# Patient Record
Sex: Male | Born: 1970 | Race: Black or African American | Hispanic: No | Smoking: Former smoker
Health system: Southern US, Community
[De-identification: ages and names within clinical notes are randomized; demographics above are authoritative.]

## PROBLEM LIST (undated history)

## (undated) DIAGNOSIS — F329 Major depressive disorder, single episode, unspecified: Secondary | ICD-10-CM

## (undated) DIAGNOSIS — F32A Depression, unspecified: Secondary | ICD-10-CM

## (undated) DIAGNOSIS — F4310 Post-traumatic stress disorder, unspecified: Secondary | ICD-10-CM

## (undated) DIAGNOSIS — K611 Rectal abscess: Secondary | ICD-10-CM

## (undated) HISTORY — DX: Rectal abscess: K61.1

## (undated) HISTORY — DX: Depression, unspecified: F32.A

## (undated) HISTORY — DX: Major depressive disorder, single episode, unspecified: F32.9

---
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
## 2005-11-30 HISTORY — PX: FOREARM SURGERY: SHX651

## 2006-06-18 ENCOUNTER — Emergency Department: Payer: Self-pay | Admitting: Emergency Medicine

## 2006-08-28 ENCOUNTER — Emergency Department: Payer: Self-pay | Admitting: Emergency Medicine

## 2007-01-28 ENCOUNTER — Emergency Department: Payer: Self-pay | Admitting: Internal Medicine

## 2007-06-16 ENCOUNTER — Emergency Department: Payer: Self-pay | Admitting: Emergency Medicine

## 2009-02-14 ENCOUNTER — Ambulatory Visit: Payer: Self-pay | Admitting: Family Medicine

## 2011-05-08 ENCOUNTER — Emergency Department: Payer: Self-pay | Admitting: Emergency Medicine

## 2012-01-03 IMAGING — CT CT ABD-PELV W/O CM
1 of 2 series · 15 of 32 positions shown, 19 images · non-contrast
Comparison: none

REASON FOR EXAM: (1) RLQ and R flank pain; (2) R flank and RLQ pain,
Stone protocol
COMMENTS:   May transport without cardiac monitor

PROCEDURE:     CT  - CT ABDOMEN AND PELVIS W[DATE]  [DATE]
RESULT:     Comparison: None
TECHNIQUE: Multiple axial images from the lung bases to the symphysis pubis
were obtained without oral and without intravenous contrast.

[Series 2: 3mm soft tissue · axial · 0.70mm/px · z∈[-1575,-1170]mm · 15 of 149 slices shown, 19 images]
[im 7/149  soft-tissue]
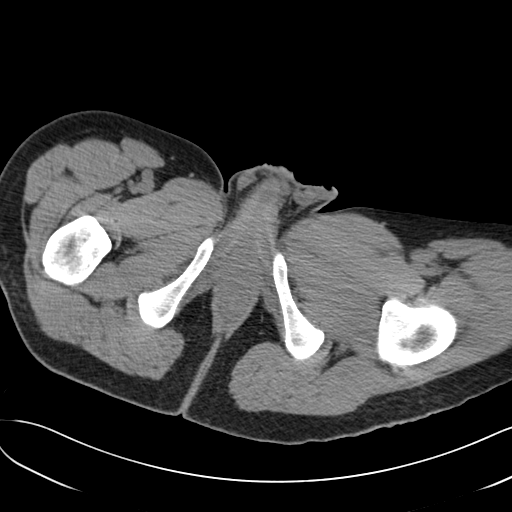
[im 7/149  bone]
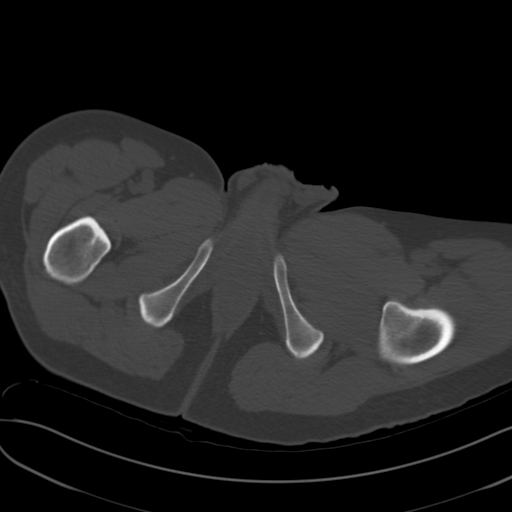
[im 19/149  soft-tissue]
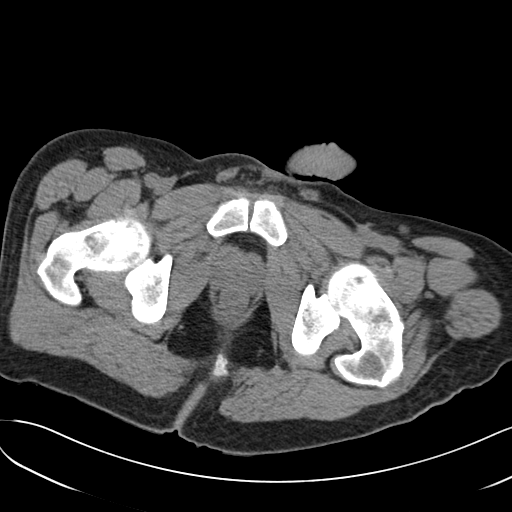
[im 31/149  soft-tissue]
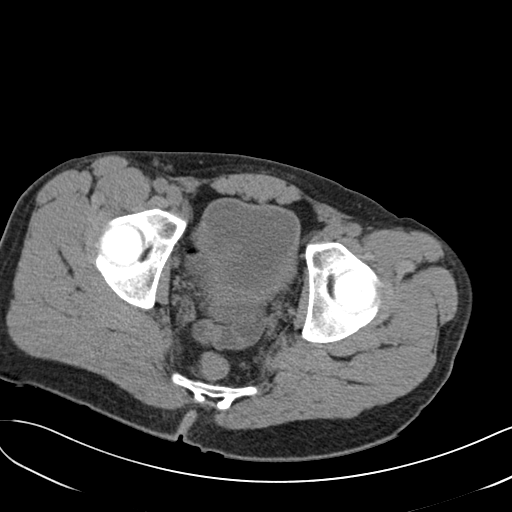
[im 44/149  soft-tissue]
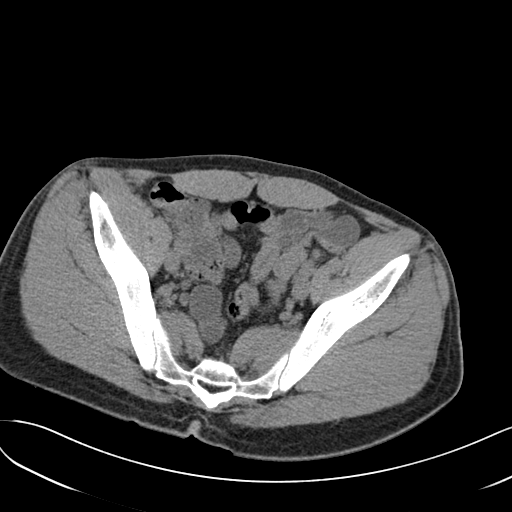
[im 50/149  soft-tissue]
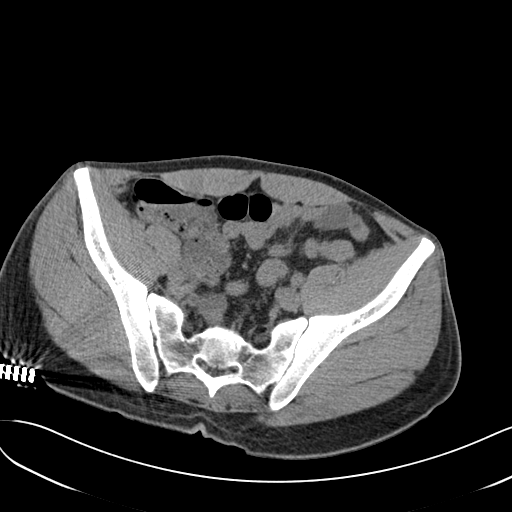
[im 62/149  soft-tissue]
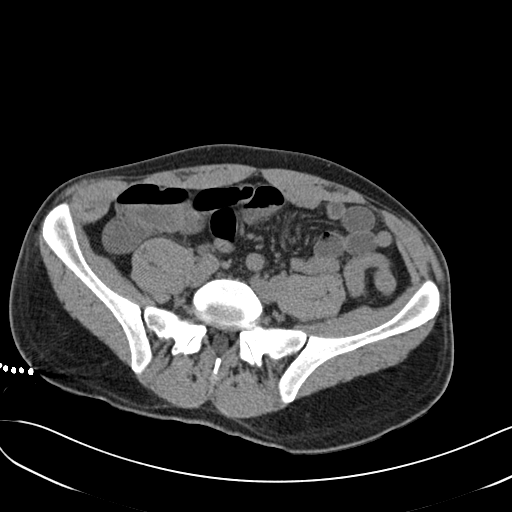
[im 75/149  soft-tissue]
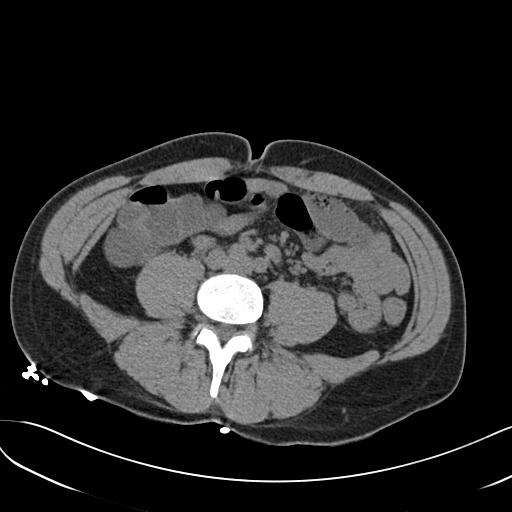
[im 87/149  soft-tissue]
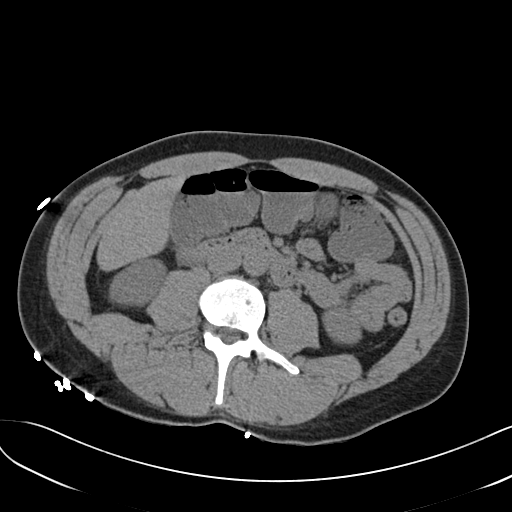
[im 99/149  soft-tissue]
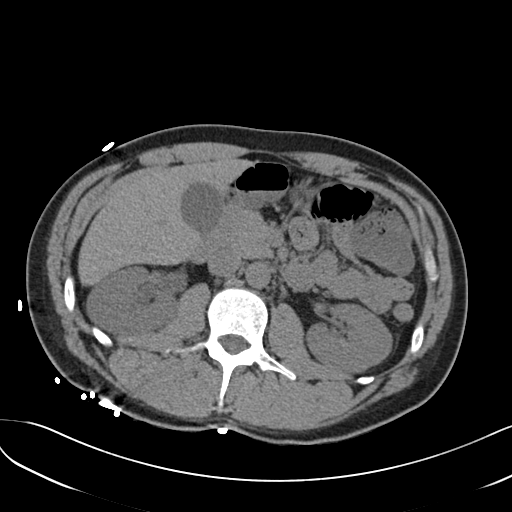
[im 99/149  bone]
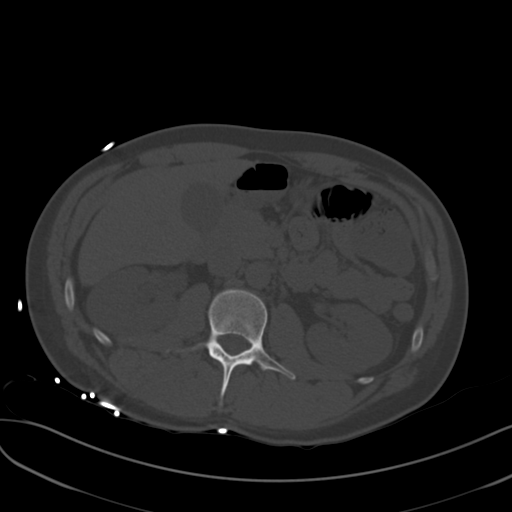
[im 105/149  soft-tissue]
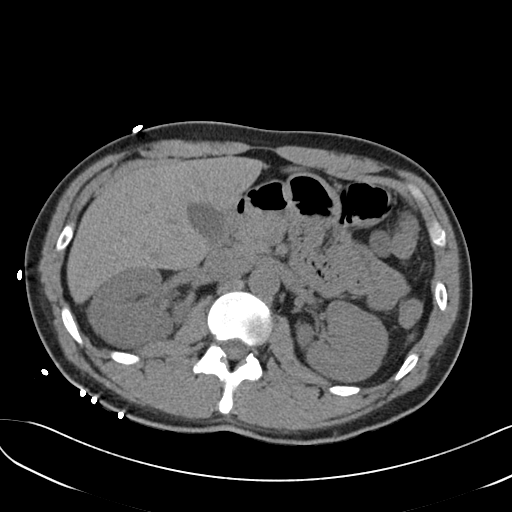
[im 118/149  soft-tissue]
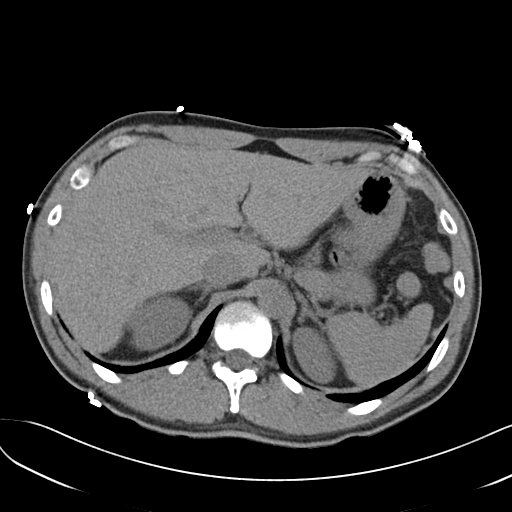
[im 124/149  lung]
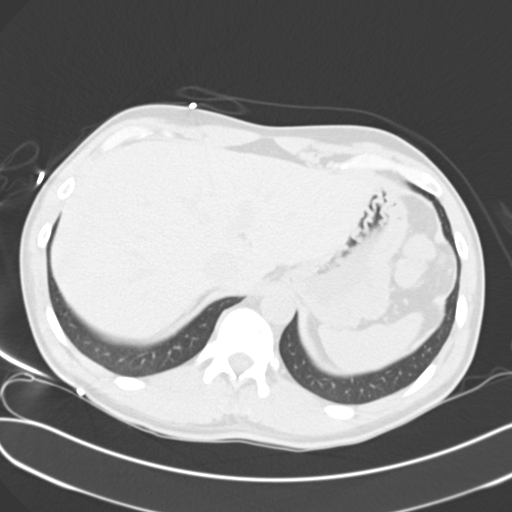
[im 130/149  soft-tissue]
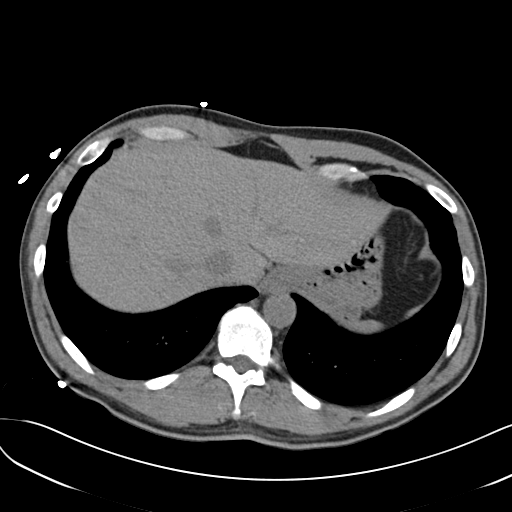
[im 130/149  lung]
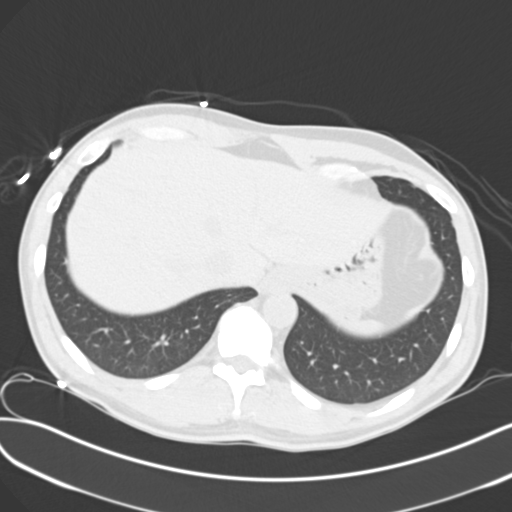
[im 136/149  lung]
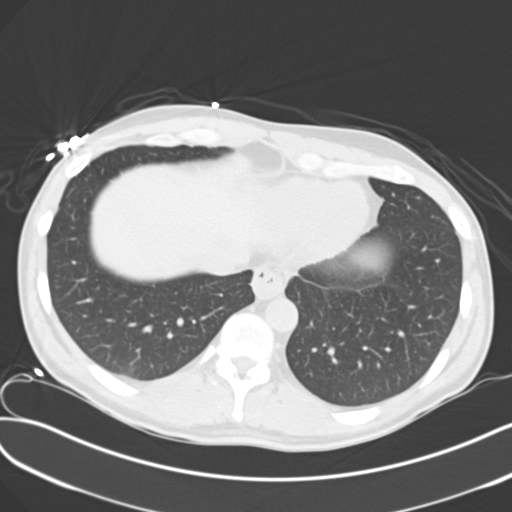
[im 142/149  soft-tissue]
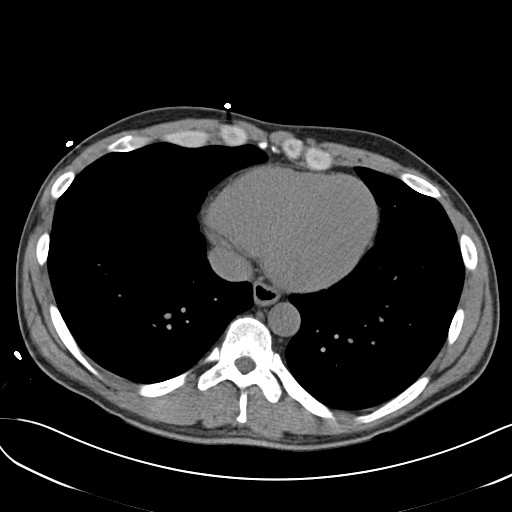
[im 142/149  lung]
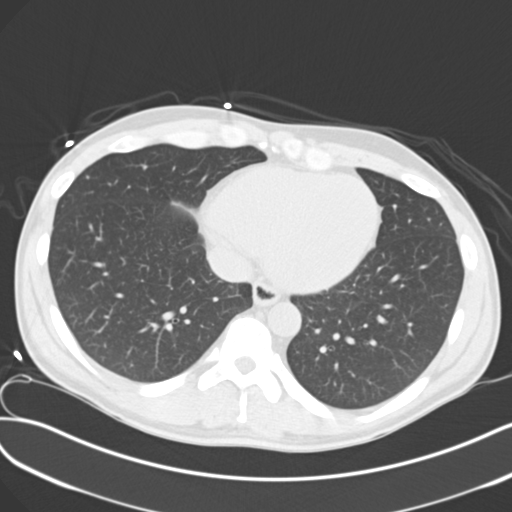

[15 of 32 positions shown; findings below may reference images not displayed]

FINDINGS: Lack of intravenous contrast limits evaluation of the solid abdominal
organs.  Grossly, the liver, gallbladder, spleen, adrenals, and pancreas are
unremarkable. There is mild dilatation of the right ureter and right renal
collecting system. A 4 mm calculus is demonstrated just proximal to the
right ureterovesical junction.

The small and large bowel are normal in caliber. The appendix is normal.

No aggressive lytic or sclerotic osseous lesions identified.
IMPRESSION: 4 mm calculus just proximal to the right ureterovesical junction, causing
mild proximal obstruction.

## 2013-08-18 DIAGNOSIS — F329 Major depressive disorder, single episode, unspecified: Secondary | ICD-10-CM | POA: Insufficient documentation

## 2014-04-10 DIAGNOSIS — R109 Unspecified abdominal pain: Secondary | ICD-10-CM | POA: Diagnosis not present

## 2014-04-10 DIAGNOSIS — R51 Headache: Secondary | ICD-10-CM | POA: Diagnosis not present

## 2014-04-10 DIAGNOSIS — R221 Localized swelling, mass and lump, neck: Secondary | ICD-10-CM | POA: Diagnosis not present

## 2014-04-10 DIAGNOSIS — R220 Localized swelling, mass and lump, head: Secondary | ICD-10-CM | POA: Diagnosis not present

## 2014-04-10 DIAGNOSIS — R599 Enlarged lymph nodes, unspecified: Secondary | ICD-10-CM | POA: Diagnosis not present

## 2014-04-10 DIAGNOSIS — R5383 Other fatigue: Secondary | ICD-10-CM | POA: Diagnosis not present

## 2014-04-10 DIAGNOSIS — T148 Other injury of unspecified body region: Secondary | ICD-10-CM | POA: Diagnosis not present

## 2014-04-10 DIAGNOSIS — R5381 Other malaise: Secondary | ICD-10-CM | POA: Diagnosis not present

## 2014-04-10 DIAGNOSIS — F17200 Nicotine dependence, unspecified, uncomplicated: Secondary | ICD-10-CM | POA: Diagnosis not present

## 2014-04-10 DIAGNOSIS — M542 Cervicalgia: Secondary | ICD-10-CM | POA: Diagnosis not present

## 2016-04-13 DIAGNOSIS — F250 Schizoaffective disorder, bipolar type: Secondary | ICD-10-CM | POA: Diagnosis not present

## 2016-04-15 ENCOUNTER — Encounter: Payer: Self-pay | Admitting: *Deleted

## 2016-04-15 ENCOUNTER — Ambulatory Visit
Admission: EM | Admit: 2016-04-15 | Discharge: 2016-04-15 | Disposition: A | Discharge status: Home / Self Care | Payer: Medicare Other | Attending: Family Medicine | Admitting: Family Medicine

## 2016-04-15 DIAGNOSIS — L0291 Cutaneous abscess, unspecified: Secondary | ICD-10-CM

## 2016-04-15 HISTORY — DX: Post-traumatic stress disorder, unspecified: F43.10

## 2016-04-15 MED ORDER — SULFAMETHOXAZOLE-TRIMETHOPRIM 800-160 MG PO TABS: 1.0000 | ORAL_TABLET | Freq: Two times a day (BID) | ORAL | Status: AC

## 2016-04-15 NOTE — ED Notes (Signed)
Abcess on right buttock.

## 2016-04-15 NOTE — ED Provider Notes (Addendum)
CSN: [id number]     Arrival date & time 04/15/16  1240 History   First MD Initiated Contact with Patient 04/15/16 1313     Chief Complaint  Patient presents with  . Recurrent Skin Infections   (Consider location/radiation/quality/duration/timing/severity/associated sxs/prior Treatment) HPI: Patient presents today with symptoms of erythema and swelling of right buttock area. Patient has had no discharge of the area. Patient's symptoms started 2-3 days ago. Patient started taking some old amoxicillin that he had. He denies any fever or chills. He has had a previous abscess drained in this area a few months ago.   Past Medical History  Diagnosis Date  . PTSD (post-traumatic stress disorder)    History reviewed. No pertinent past surgical history. History reviewed. No pertinent family history. Social History  Substance Use Topics  . Smoking status: Current Every Day Smoker -- 1.00 packs/day    Types: Cigarettes  . Smokeless tobacco: None  . Alcohol Use: No    Review of Systems: Negative except mentioned above.   Allergies  Lithium  Home Medications   Prior to Admission medications   Medication Sig Start Date End Date Taking? Authorizing Provider  amphetamine-dextroamphetamine (ADDERALL) 20 MG tablet Take 20 mg by mouth daily.   Yes Historical Provider, MD  diazepam (VALIUM) 10 MG tablet Take 10 mg by mouth every 6 (six) hours as needed for anxiety.   Yes Historical Provider, MD  risperiDONE (RISPERDAL) 0.5 MG tablet Take 0.5 mg by mouth at bedtime.   Yes Historical Provider, MD  venlafaxine (EFFEXOR) 100 MG tablet Take 300 mg by mouth 2 (two) times daily.   Yes Historical Provider, MD   Meds Ordered and Administered this Visit  Medications - No data to display  BP 139/89 mmHg  Pulse 90  Temp(Src) 98.8 F (37.1 C) (Oral)  Resp 16  Ht 5\' 8"  (1.727 m)  Wt 160 lb (72.576 kg)  BMI 24.33 kg/m2  SpO2 98% No data found.   Physical Exam    GENERAL: NAD SKIN: 1 in.- 2 in.  area of  mild erythema and tenderness of right buttock near the cleft which is extending to the rectal area, no abscess identified at the rectum now, small area of induration proximal to the rectal area, no fluctuance appreciated, no discharge of the area    ED Course  Procedures (including critical care time)  Labs Review Labs Reviewed - No data to display  Imaging Review No results found.    MDM   1. Abscess   I've recommended that the patient use warm compresses on the area, will start Bactrim DS, has appointment with general surgery in 2 days. If symptoms do worsen before that time I have asked the patient go to the ER immediately. Patient addresses understanding of plan.    Jolene ProvostKirtida Pranish Akhavan, MD 04/15/16 1334  Jolene ProvostKirtida Maysel Mccolm, MD 04/15/16 1341

## 2016-04-15 NOTE — Discharge Instructions (Signed)
Follow-up with surgeon as instructed. If symptoms do worsen I have recommended that the patient go to the ER.

## 2016-04-17 ENCOUNTER — Other Ambulatory Visit: Payer: Self-pay

## 2016-04-17 ENCOUNTER — Ambulatory Visit (INDEPENDENT_AMBULATORY_CARE_PROVIDER_SITE_OTHER): Payer: Medicare Other | Admitting: Surgery

## 2016-04-17 ENCOUNTER — Encounter: Payer: Self-pay | Admitting: Surgery

## 2016-04-17 VITALS — BP 138/91 | HR 111 | Temp 98.3°F | Ht 68.0 in | Wt 163.8 lb

## 2016-04-17 DIAGNOSIS — K611 Rectal abscess: Secondary | ICD-10-CM | POA: Diagnosis not present

## 2016-04-17 MED ORDER — OXYCODONE-ACETAMINOPHEN 5-325 MG PO TABS: 1.0000 | ORAL_TABLET | ORAL | Status: DC | PRN

## 2016-04-17 NOTE — Progress Notes (Signed)
Patient ID: Ralph Mcgrath, male   DOB: Aug 23, 1971, 45 y.o.   MRN: [id number]  History of Present Illness Ralph Mcgrath is a 45 y.o. male in consultation for severe anorectal pain for the last week or so. He reports that over the last 3 days the pain has been severe sharp and located in the right side of his buttocks. Pain is worsening when he sits down. No fevers no chills He was recently seen by the urgent care and placed on Bactrim. He has had recurrent and perirectal abscesses for last couple of years and he has had an I&D and in the past in [place]. And he does have a history of chronic pain, PTSD and bipolar disorder.  Past Medical History Past Medical History  Diagnosis Date  . PTSD (post-traumatic stress disorder)   . Peri-rectal abscess     Right  . Depression      Past Surgical History  Procedure Laterality Date  . Wisdom tooth extraction  1990  . Forearm surgery Right 2007  . Orbital fracture surgery Left 1992    Allergies  Allergen Reactions  . Lithium Other (See Comments)    Hallucinations    Current Outpatient Prescriptions  Medication Sig Dispense Refill  . amphetamine-dextroamphetamine (ADDERALL) 20 MG tablet Take 20 mg by mouth daily.    . diazepam (VALIUM) 10 MG tablet Take 10 mg by mouth every 6 (six) hours as needed for anxiety.    . risperiDONE (RISPERDAL) 0.5 MG tablet Take 0.5 mg by mouth at bedtime.    . sulfamethoxazole-trimethoprim (BACTRIM DS,SEPTRA DS) 800-160 MG tablet Take 1 tablet by mouth 2 (two) times daily. 20 tablet 0  . venlafaxine XR (EFFEXOR-XR) 150 MG 24 hr capsule Take 1 capsule by mouth 1 day or 1 dose.     No current facility-administered medications for this visit.    Family History Family History  Problem Relation Age of Onset  . Depression Mother   . Cancer Mother     Breast and Uterine  . Hyperthyroidism Father   . Bipolar disorder Father   . Heart disease Father      Social History Social History  Substance Use Topics   . Smoking status: Current Every Day Smoker -- 1.00 packs/day    Types: Cigarettes  . Smokeless tobacco: Never Used  . Alcohol Use: No     ROS 10 pt ROS is otherwise negative   Physical Exam Blood pressure 138/91, pulse 111, temperature 98.3 F (36.8 C), temperature source Oral, height  (1.727 m), weight 74.299 kg (163 lb 12.8 oz).  CONSTITUTIONAL: NAD EARS, NOSE, MOUTH AND THROAT: . Oral mucosa is pink and moist. Hearing is intact to voice.  NECK: Trachea is midline, and there is no jugular venous distension. Thyroid is without palpable abnormalities. LYMPH NODES:  Lymph nodes in the neck are not enlarged. RESPIRATORY:  Lungs are clear, and breath sounds are equal bilaterally. Normal respiratory effort without pathologic use of accessory muscles. CARDIOVASCULAR: Heart is regular without murmurs, gallops, or rubs. GI: The abdomen is  soft, nontender, and nondistended. There were no palpable masses. There was no hepatosplenomegaly. . Rectal: medium size perirectal abscess on the right side, tender and fluctuant MUSCULOSKELETAL:  Normal muscle strength and tone in all four extremities.    SKIN: Skin turgor is normal. There are no pathologic skin lesions.  NEUROLOGIC:  Motor and sensation is grossly normal.  Cranial nerves are grossly intact. PSYCH:  Alert and oriented to person,  place and time. Affect is normal.  Data Reviewed  I have personally reviewed the patient's imaging and medical records.    Assessment/Plan Right perirectal abscess. The patient in detail about the disease process and the need for I&D. And I do think that we can potentially do it under local anesthetic here in the office. Also all for him to do it in the OR tomorrow by one of my partners. He wishes to try to do it here in the office. Discussed with the patient in detail about the procedure, risks, benefits and possible complications including but not limited to: Bleeding, infection, recurrence, prolonged  pain. He understands and wishes to proceed. Consent obtained  Procedure note: Procedures: I/D perirectal abscess  EBL minimal  Findings: abscess  Complications none  He was prepped and draped in the usual sterile fashion and placed in lateral decubitus position. Lidocaine 1% with epinephrine was injected and using a 11 blade knife we drained the abscesses. Purulence was drained, cavity was irrigated. An 1/4-inch packing was placed. No complications  Ralph Pagan, MD FACS  Ralph Mcgrath F Ralph Mcgrath 04/17/2016, 9:32 AM

## 2016-04-17 NOTE — Patient Instructions (Signed)
Please pack this area once daily with packing provided and dress area with dry guaze. If bandage becomes soiled, you will need to replace at that time as well. If you run out of guaze, you may pick this up at any drug store.  Continue taking your bactrim as originally scheduled.  Use the pain medication given today to help relieve severe pain. You may use Ibuprofen, up to 800mg  three times daily for mild-moderate pain.  Please follow-up in office as scheduled below.

## 2016-04-20 ENCOUNTER — Other Ambulatory Visit: Payer: Self-pay | Admitting: Surgery

## 2016-04-20 NOTE — Telephone Encounter (Signed)
Patient was in the office on 5/19 to see Dr Everlene FarrierPabon and had procedure for perirectal abscess. His wife is calling because he is in a lot of pain and would like a refill on his medication.

## 2016-04-20 NOTE — Telephone Encounter (Signed)
I spoke with patient and discussed  the amount of pain medicine and let him know we are unable to refill at this time.  His appointment was moved from Friday to Wednesday of this week. Denies fever, chills. He verbalized understanding and was satisfied that his appointment was moved up for him to be evaluated sooner.

## 2016-04-24 ENCOUNTER — Encounter: Payer: Self-pay | Admitting: Surgery

## 2016-04-24 ENCOUNTER — Ambulatory Visit (INDEPENDENT_AMBULATORY_CARE_PROVIDER_SITE_OTHER): Payer: Medicare Other | Admitting: Surgery

## 2016-04-24 VITALS — BP 134/92 | HR 89 | Temp 98.2°F | Ht 68.0 in | Wt 163.2 lb

## 2016-04-24 DIAGNOSIS — K611 Rectal abscess: Secondary | ICD-10-CM | POA: Insufficient documentation

## 2016-04-24 MED ORDER — OXYCODONE-ACETAMINOPHEN 5-325 MG PO TABS: 1.0000 | ORAL_TABLET | ORAL | Status: DC | PRN

## 2016-04-24 NOTE — Patient Instructions (Signed)
Please call our office if you have any questions or concerns. Please see follow up appointment listed below.

## 2016-04-24 NOTE — Progress Notes (Signed)
45yr old male who had perirectal abscess I&D with Dr. Everlene FarrierPabon on 5/19.  Patient states that he's been having some pain in the area and he is out of his pain medicines at this time. Patient states she's been taken ibuprofen 800s but only twice a day. Patient states that he sometimes will have these bad electrical shooting pains on the air fill like spasms. Patient states that he has had bowel movements but was previously trying to hold them and so they have been slightly harder. Patient has been doing some sitz baths in the to help with the area as well. She was previously packing the area but was unable to continue packing due to healing.  He denies any fever or chlls.   Filed Vitals:   04/24/16 0914  BP: 134/92  Pulse: 89  Temp: 98.2 F (36.8 C)   PE:  Gen: NAD Rectum: healing area to right of anus, no induration, no drainage, good granulation tissue.   A/P: Patient is continuing to heal well. Discussed giving him Flexeril however the patient and want anything that can make him sleepy. Also discussed that he can uses Valium when necessary for this. Will give him an additional prescription for his pain medication today and instructed him to use stool softeners prunes or other things that will help him have normal soft bowel movements. We'll have him follow-up in 2 weeks for wound check.

## 2016-05-13 ENCOUNTER — Ambulatory Visit: Payer: Self-pay | Admitting: General Surgery

## 2016-08-17 ENCOUNTER — Emergency Department
Admission: EM | Admit: 2016-08-17 | Discharge: 2016-08-18 | Disposition: A | Discharge status: Home / Self Care | Payer: Medicare Other | Attending: Student in an Organized Health Care Education/Training Program | Admitting: Student in an Organized Health Care Education/Training Program

## 2016-08-17 ENCOUNTER — Emergency Department: Payer: Medicare Other

## 2016-08-17 ENCOUNTER — Encounter: Payer: Self-pay | Admitting: Medical Oncology

## 2016-08-17 DIAGNOSIS — F17210 Nicotine dependence, cigarettes, uncomplicated: Secondary | ICD-10-CM | POA: Diagnosis not present

## 2016-08-17 DIAGNOSIS — L0231 Cutaneous abscess of buttock: Secondary | ICD-10-CM | POA: Diagnosis not present

## 2016-08-17 LAB — COMPREHENSIVE METABOLIC PANEL
ALBUMIN: 4.2 g/dL (ref 3.5–5.0)
ALK PHOS: 71 U/L (ref 38–126)
ALT: 15 U/L — ABNORMAL LOW (ref 17–63)
AST: 17 U/L (ref 15–41)
Anion gap: 6 (ref 5–15)
BILIRUBIN TOTAL: 0.4 mg/dL (ref 0.3–1.2)
BUN: 8 mg/dL (ref 6–20)
CALCIUM: 9.1 mg/dL (ref 8.9–10.3)
CO2: 30 mmol/L (ref 22–32)
Chloride: 103 mmol/L (ref 101–111)
Creatinine, Ser: 0.97 mg/dL (ref 0.61–1.24)
GFR calc Af Amer: >60 mL/min (ref >60)
GFR calc non Af Amer: >60 mL/min (ref >60)
GLUCOSE: 89 mg/dL (ref 65–99)
Potassium: 3.3 mmol/L — ABNORMAL LOW (ref 3.5–5.1)
Sodium: 139 mmol/L (ref 135–145)
TOTAL PROTEIN: 7.5 g/dL (ref 6.5–8.1)

## 2016-08-17 LAB — CBC WITH DIFFERENTIAL/PLATELET
BASOS ABS: 0.1 10*3/uL (ref 0–0.1)
BASOS PCT: 0 %
Eosinophils Absolute: 0.2 10*3/uL (ref 0–0.7)
Eosinophils Relative: 1 %
HEMATOCRIT: 41.9 % (ref 40.0–52.0)
HEMOGLOBIN: 14.5 g/dL (ref 13.0–18.0)
LYMPHS PCT: 34 %
Lymphs Abs: 4.3 10*3/uL — ABNORMAL HIGH (ref 1.0–3.6)
MCH: 31 pg (ref 26.0–34.0)
MCHC: 34.6 g/dL (ref 32.0–36.0)
MCV: 89.5 fL (ref 80.0–100.0)
MONO ABS: 0.7 10*3/uL (ref 0.2–1.0)
Monocytes Relative: 5 %
NEUTROS ABS: 7.5 10*3/uL — AB (ref 1.4–6.5)
NEUTROS PCT: 60 %
Platelets: 349 10*3/uL (ref 150–440)
RBC: 4.68 MIL/uL (ref 4.40–5.90)
RDW: 14.7 % — ABNORMAL HIGH (ref 11.5–14.5)
WBC: 12.7 10*3/uL — AB (ref 3.8–10.6)

## 2016-08-17 MED ORDER — IOPAMIDOL (ISOVUE-300) INJECTION 61%
100.0000 mL | Freq: Once | INTRAVENOUS | Status: AC | PRN
  Administered 2016-08-17: 100 mL via INTRAVENOUS
  Filled 2016-08-17: qty 100

## 2016-08-17 MED ORDER — LIDOCAINE-EPINEPHRINE (PF) 1 %-1:200000 IJ SOLN
10.0000 mL | Freq: Once | INTRAMUSCULAR | Status: DC
  Filled 2016-08-17: qty 30

## 2016-08-17 MED ORDER — IOPAMIDOL (ISOVUE-300) INJECTION 61%
15.0000 mL | INTRAVENOUS | Status: AC
  Administered 2016-08-17: 15 mL via ORAL
  Filled 2016-08-17 (×2): qty 30

## 2016-08-17 MED ORDER — SODIUM CHLORIDE 0.9 % IV BOLUS (SEPSIS)
1000.0000 mL | Freq: Once | INTRAVENOUS | Status: AC
  Administered 2016-08-17: 1000 mL via INTRAVENOUS

## 2016-08-17 MED ORDER — CLINDAMYCIN PHOSPHATE 600 MG/50ML IV SOLN
600.0000 mg | Freq: Once | INTRAVENOUS | Status: AC
  Administered 2016-08-17: 600 mg via INTRAVENOUS
  Filled 2016-08-17: qty 50

## 2016-08-17 NOTE — ED Triage Notes (Signed)
Abscess to buttock yesterday noted.

## 2016-08-17 NOTE — ED Provider Notes (Signed)
[facility] Emergency Department Provider Note  ____________________________________________  Time seen: Approximately 7:19 PM  I have reviewed the triage vital signs and the nursing notes.   HISTORY  Chief Complaint Abscess    HPI Ralph Mcgrath is a 45 y.o. male who presents emergency department complaining of her abscess to the rectal region. Patient states that this is his third abscess to this region. When questioned further patient states that abscess has returned to the same spot on the right edge of the anus. Patient states that he first noticed area yesterday. It is grown in both size, as well as in severity of pain. Patient denies any systemic symptoms such as fevers or chills, abdominal pain, nausea or vomiting. Patient states that typically "a drain it and send me home with antibiotics." He has never [place] representative for this complaint. No other history of recurrent skin lesions. Patient reports that the pain is severe, constant, unabated by over-the-counter medications. He denies any rectal bleeding.   Past Medical History:  Diagnosis Date  . Depression   . Peri-rectal abscess    Right  . PTSD (post-traumatic stress disorder)     Patient Active Problem List   Diagnosis Date Noted  . Peri-rectal abscess 04/24/2016  . Clinical depression 08/18/2013    Past Surgical History:  Procedure Laterality Date  . FOREARM SURGERY Right 2007  . ORBITAL FRACTURE SURGERY Left 1992  . WISDOM TOOTH EXTRACTION  1990    Prior to Admission medications   Medication Sig Start Date End Date Taking? Authorizing Provider  amphetamine-dextroamphetamine (ADDERALL) 20 MG tablet Take 20 mg by mouth daily.    Historical Provider, MD  clindamycin (CLEOCIN) 300 MG capsule Take 1 capsule (300 mg total) by mouth 4 (four) times daily. 08/18/16   Delorise Royals Cuthriell, PA-C  diazepam (VALIUM) 10 MG tablet Take 10 mg by mouth every 6 (six) hours as needed for anxiety.     Historical Provider, MD  HYDROcodone-acetaminophen (NORCO/VICODIN) 5-325 MG tablet Take 1 tablet by mouth every 6 (six) hours as needed for severe pain. 08/18/16   Delorise Royals Cuthriell, PA-C  oxyCODONE-acetaminophen (ROXICET) 5-325 MG tablet Take 1 tablet by mouth every 4 (four) hours as needed for severe pain. 04/24/16   Gladis Riffle, MD  risperiDONE (RISPERDAL) 0.5 MG tablet Take 0.5 mg by mouth at bedtime.    Historical Provider, MD  venlafaxine XR (EFFEXOR-XR) 150 MG 24 hr capsule Take 1 capsule by mouth 1 day or 1 dose. 04/15/16   Historical Provider, MD    Allergies Lithium  Family History  Problem Relation Age of Onset  . Depression Mother   . Cancer Mother     Breast and Uterine  . Hyperthyroidism Father   . Bipolar disorder Father   . Heart disease Father     Social History Social History  Substance Use Topics  . Smoking status: Current Every Day Smoker    Packs/day: 1.00    Types: Cigarettes  . Smokeless tobacco: Never Used  . Alcohol use No     Review of Systems  Constitutional: No fever/chills Cardiovascular: no chest pain. Respiratory: no cough. No SOB. Gastrointestinal: No abdominal pain.  No nausea, no vomiting.  No diarrhea.  No constipation. Positive for perirectal abscess. Musculoskeletal: Negative for musculoskeletal pain. Skin: Negative for rash, abrasions, lacerations, ecchymosis. Positive for perirectal abscess Neurological: Negative for headaches, focal weakness or numbness. 10-point ROS otherwise negative.  ____________________________________________   PHYSICAL EXAM:  VITAL SIGNS: ED Triage  Vitals  Enc Vitals Group     BP 08/17/16 1828 (!) 144/98     Pulse Rate 08/17/16 1828 99     Resp 08/17/16 1828 17     Temp 08/17/16 1828 99 F (37.2 C)     Temp Source 08/17/16 1828 Oral     SpO2 08/17/16 1828 96 %     Weight 08/17/16 1829 160 lb (72.6 kg)     Height 08/17/16 1829 5\' 8"  (1.727 m)     Head Circumference --      Peak Flow --       Pain Score 08/17/16 1829 7     Pain Loc --      Pain Edu? --      Excl. in GC? --      Constitutional: Alert and oriented. Well appearing and in no acute distress. Eyes: Conjunctivae are normal. PERRL. EOMI. Head: Atraumatic. Cardiovascular: Normal rate, regular rhythm. Normal S1 and S2.  Good peripheral circulation. Respiratory: Normal respiratory effort without tachypnea or retractions. Lungs CTAB. Good air entry to the bases with no decreased or absent breath sounds. Gastrointestinal: Bowel sounds 4 quadrants. Soft and nontender to palpation. No guarding or rigidity. No palpable masses. No distention. Positive for indurated, fluctuant lesion to the right perianal/rectal region. Center of fluctuance is approximately one and half to 2 cm lateral to the anus. Area is erythematous. No significant surrounding cellulitis. No drainage. Musculoskeletal: Full range of motion to all extremities. No gross deformities appreciated. Neurologic:  Normal speech and language. No gross focal neurologic deficits are appreciated.  Skin:  Skin is warm, dry and intact. No rash noted. Psychiatric: Mood and affect are normal. Speech and behavior are normal. Patient exhibits appropriate insight and judgement.   ____________________________________________   LABS (all labs ordered are listed, but only abnormal results are displayed)  Labs Reviewed  CBC WITH DIFFERENTIAL/PLATELET - Abnormal; Notable for the following:       Result Value   WBC 12.7 (*)    RDW 14.7 (*)    Neutro Abs 7.5 (*)    Lymphs Abs 4.3 (*)    All other components within normal limits  COMPREHENSIVE METABOLIC PANEL - Abnormal; Notable for the following:    Potassium 3.3 (*)    ALT 15 (*)    All other components within normal limits   ____________________________________________  EKG   ____________________________________________  RADIOLOGY Festus Barren Cuthriell, personally viewed and evaluated these images  as part of my  medical decision making, as well as reviewing the written report by the radiologist.  Ct Abdomen Pelvis W Contrast  Result Date: 08/17/2016 CLINICAL DATA:  Evaluation for pre current perirectal abscess. EXAM: CT ABDOMEN AND PELVIS WITH CONTRAST TECHNIQUE: Multidetector CT imaging of the abdomen and pelvis was performed using the standard protocol following bolus administration of intravenous contrast. CONTRAST:  ISOVUE-300 IOPAMIDOL (ISOVUE-300) INJECTION 61% COMPARISON:  Prior CT from 05/08/2011. FINDINGS: Lower chest: Mild deep tendon atelectasis present within the visualized lung bases. Visualized lungs are otherwise clear. Hepatobiliary: Liver demonstrates a normal contrast enhanced appearance. Gallbladder normal. No biliary dilatation. Pancreas: Pancreas within normal limits. Spleen: Spleen is normal. Adrenals/Urinary Tract: Adrenal glands within normal limits. Kidneys equal in size with symmetric enhancement. Subcentimeter hypodensity noted within the right kidney, too small the characterize, but statistically likely reflects a small cyst. No nephrolithiasis, hydronephrosis, or focal enhancing renal mass. Ureters within normal limits. Circumferential bladder wall thickening, like related incomplete distension. Stomach/Bowel: Stomach within normal limits. No  evidence for bowel obstruction. Appendix normal. No abnormal wall thickening, mucosal enhancement, or inflammatory fat stranding seen about the bowels. Vascular/Lymphatic: Normal intravascular enhancement seen throughout the intra-abdominal aorta and its branch vessels. No adenopathy. Reproductive: Prostate within normal limits. Other: No free air or fluid. 2.3 x 0.7 x 2.7 cm rim enhancing hypodense collection along the right gluteal cleft, consistent with abscess (series 2, image 87). This is fairly removed from the rectum anteriorly. Mild surrounding soft tissue stranding. No other abscess or loculated collection. Musculoskeletal: No acute  osseous abnormality. No worrisome lytic or blastic osseous lesion. IMPRESSION: 1. 2.3 x 0.7 x 2.7 cm loculated abscess along the right gluteal cleft as above. 2. No other CT evidence for acute intra-abdominal or pelvic process. Electronically Signed   By: Rise MuBenjamin  McClintock M.D.   On: 08/17/2016 22:36    ____________________________________________    PROCEDURES  Procedure(s) performed:    Marland Kitchen.Marland Kitchen.Incision and Drainage Date/Time: 08/18/2016 12:23 AM Performed by: Gala RomneyUTHRIELL, JONATHAN D Authorized by: Gala RomneyUTHRIELL, JONATHAN D   Consent:    Consent obtained:  Verbal   Consent given by:  Patient   Risks discussed:  Pain   Alternatives discussed:  Referral Location:    Type:  Abscess   Location:  Anogenital   Anogenital location:  Gluteal cleft Pre-procedure details:    Skin preparation:  Betadine Anesthesia (see MAR for exact dosages):    Anesthesia method:  Local infiltration   Local anesthetic:  Lidocaine 1% WITH epi Procedure type:    Complexity:  Complex Procedure details:    Incision types:  Single straight   Incision depth:  Dermal   Scalpel blade:  11   Wound management:  Probed and deloculated and irrigated with saline   Drainage:  Purulent   Drainage amount:  Moderate   Packing materials:  1/4 in iodoform gauze Post-procedure details:    Patient tolerance of procedure:  Tolerated well, no immediate complications      Medications  iopamidol (ISOVUE-300) 61 % injection 15 mL (15 mLs Oral Contrast Given 08/17/16 1936)  lidocaine-EPINEPHrine (XYLOCAINE-EPINEPHrine) 1 %-1:200000 (PF) injection 10 mL (not administered)  sodium chloride 0.9 % bolus 1,000 mL (0 mLs Intravenous Stopped 08/18/16 0020)  clindamycin (CLEOCIN) IVPB 600 mg (0 mg Intravenous Stopped 08/17/16 2050)  iopamidol (ISOVUE-300) 61 % injection 100 mL (100 mLs Intravenous Contrast Given 08/17/16 2219)     ____________________________________________   INITIAL IMPRESSION / ASSESSMENT AND PLAN / ED  COURSE  Pertinent labs & imaging results that were available during my care of the patient were reviewed by me and considered in my medical decision making (see chart for details).  Review of the [place] CSRS was performed in accordance of the [facility] prior to dispensing any controlled drugs.  Clinical Course    Patient's diagnosis is consistent with Abscess to the right buttocks. Patient has had 3 abscesses in this region over the last 7 months. They have all been incised and drained at time of visit. Patient has not had a surgical consult. Due to repeated abscess in the same region, close to the rectum/anus, it was felt that patient would best be served with imaging at this time to evaluate for possible fistula. CT reveals localized abscess. No signs of fistula at this time. Labs returned with a reassuring results. Patient's white blood cell count is mildly elevated at this time. No indication for sepsis. As such, incision and drainage is performed in the emergency department as described above. Patient tolerated well. Area is packed and  dressed. Patient was given IV clindamycin in the emergency department.Marland Kitchen He is given instructions to follow up with this department in 2 days for wound recheck. Patient will be placed on oral clindamycin. Patient is also given a limited prescription for narcotic pain medication..  Patient is given ED precautions to return to the ED for any worsening or new symptoms.     ____________________________________________  FINAL CLINICAL IMPRESSION(S) / ED DIAGNOSES  Final diagnoses:  Abscess of buttock, right      NEW MEDICATIONS STARTED DURING THIS VISIT:  New Prescriptions   CLINDAMYCIN (CLEOCIN) 300 MG CAPSULE    Take 1 capsule (300 mg total) by mouth 4 (four) times daily.   HYDROCODONE-ACETAMINOPHEN (NORCO/VICODIN) 5-325 MG TABLET    Take 1 tablet by mouth every 6 (six) hours as needed for severe pain.        This chart was dictated using voice recognition  software/Dragon. Despite best efforts to proofread, errors can occur which can change the meaning. Any change was purely unintentional.    Racheal Patches, PA-C 08/18/16 0025    Willy Eddy, MD 08/18/16 [phone]

## 2016-08-18 MED ORDER — CLINDAMYCIN HCL 300 MG PO CAPS: 300.0000 mg | ORAL_CAPSULE | Freq: Four times a day (QID) | ORAL | 0 refills | Status: DC

## 2016-08-18 MED ORDER — HYDROCODONE-ACETAMINOPHEN 5-325 MG PO TABS: 1.0000 | ORAL_TABLET | Freq: Four times a day (QID) | ORAL | 0 refills | Status: DC | PRN

## 2016-10-12 ENCOUNTER — Encounter: Payer: Self-pay | Admitting: Emergency Medicine

## 2016-10-12 ENCOUNTER — Emergency Department
Admission: EM | Admit: 2016-10-12 | Discharge: 2016-10-12 | Disposition: A | Discharge status: Home / Self Care | Payer: Medicare Other | Attending: Emergency Medicine | Admitting: Emergency Medicine

## 2016-10-12 DIAGNOSIS — L03317 Cellulitis of buttock: Secondary | ICD-10-CM | POA: Diagnosis not present

## 2016-10-12 DIAGNOSIS — F17210 Nicotine dependence, cigarettes, uncomplicated: Secondary | ICD-10-CM | POA: Insufficient documentation

## 2016-10-12 DIAGNOSIS — Z79899 Other long term (current) drug therapy: Secondary | ICD-10-CM | POA: Insufficient documentation

## 2016-10-12 DIAGNOSIS — F1290 Cannabis use, unspecified, uncomplicated: Secondary | ICD-10-CM | POA: Diagnosis not present

## 2016-10-12 DIAGNOSIS — L0231 Cutaneous abscess of buttock: Secondary | ICD-10-CM | POA: Diagnosis present

## 2016-10-12 MED ORDER — CLINDAMYCIN HCL 300 MG PO CAPS: 300.0000 mg | ORAL_CAPSULE | Freq: Four times a day (QID) | ORAL | 0 refills | Status: DC

## 2016-10-12 MED ORDER — HYDROCODONE-ACETAMINOPHEN 5-325 MG PO TABS: 1.0000 | ORAL_TABLET | ORAL | 0 refills | Status: DC | PRN

## 2016-10-12 NOTE — ED Provider Notes (Signed)
[facility] Emergency Department Provider Note  ____________________________________________  Time seen: Approximately 3:52 PM  I have reviewed the triage vital signs and the nursing notes.   HISTORY  Chief Complaint Abscess    HPI Ralph Mcgrath is a 45 y.o. male who presents emergency department complaining of reoccurring abscess to the right buttocks. Patient has had 4 episodes in the last year. He has been evaluated previously with CT scan with no evidence of fistula. This occurs in the right medial buttocks just lateral to the intergluteal cleft. Patient reports this time he recognized symptoms and presents as soon as there is redness and firmness. He denies any drainage. He denies any fluctuance. Patient denies any fevers or chills, abdominal pain, nausea or vomiting. Patient has been on clindamycin in the past for these infections. No other complaints at this time..   Past Medical History:  Diagnosis Date  . Depression   . Peri-rectal abscess    Right  . PTSD (post-traumatic stress disorder)     Patient Active Problem List   Diagnosis Date Noted  . Peri-rectal abscess 04/24/2016  . Clinical depression 08/18/2013    Past Surgical History:  Procedure Laterality Date  . FOREARM SURGERY Right 2007  . ORBITAL FRACTURE SURGERY Left 1992  . WISDOM TOOTH EXTRACTION  1990    Prior to Admission medications   Medication Sig Start Date End Date Taking? Authorizing Provider  amphetamine-dextroamphetamine (ADDERALL) 20 MG tablet Take 20 mg by mouth daily.    Historical Provider, MD  clindamycin (CLEOCIN) 300 MG capsule Take 1 capsule (300 mg total) by mouth 4 (four) times daily. 10/12/16   Delorise RoyalsJonathan D Cuthriell, PA-C  diazepam (VALIUM) 10 MG tablet Take 10 mg by mouth every 6 (six) hours as needed for anxiety.    Historical Provider, MD  HYDROcodone-acetaminophen (NORCO/VICODIN) 5-325 MG tablet Take 1 tablet by mouth every 4 (four) hours as needed for  moderate pain. 10/12/16   Delorise RoyalsJonathan D Cuthriell, PA-C  risperiDONE (RISPERDAL) 0.5 MG tablet Take 0.5 mg by mouth at bedtime.    Historical Provider, MD  venlafaxine XR (EFFEXOR-XR) 150 MG 24 hr capsule Take 1 capsule by mouth 1 day or 1 dose. 04/15/16   Historical Provider, MD    Allergies Lithium  Family History  Problem Relation Age of Onset  . Depression Mother   . Cancer Mother     Breast and Uterine  . Hyperthyroidism Father   . Bipolar disorder Father   . Heart disease Father     Social History Social History  Substance Use Topics  . Smoking status: Current Every Day Smoker    Packs/day: 1.00    Types: Cigarettes  . Smokeless tobacco: Never Used  . Alcohol use No     Review of Systems  Constitutional: No fever/chills Cardiovascular: no chest pain. Respiratory: no cough. No SOB. Gastrointestinal: No abdominal pain.  No nausea, no vomiting.  No diarrhea.  No constipation. Musculoskeletal: Negative for musculoskeletal pain. Skin: Positive for abscess to the right buttocks Neurological: Negative for headaches, focal weakness or numbness. 10-point ROS otherwise negative.  ____________________________________________   PHYSICAL EXAM:  VITAL SIGNS: ED Triage Vitals  Enc Vitals Group     BP 10/12/16 1352 (!) 131/95     Pulse Rate 10/12/16 1352 80     Resp 10/12/16 1352 18     Temp 10/12/16 1352 98.3 F (36.8 C)     Temp Source 10/12/16 1352 Oral     SpO2 10/12/16 1352  98 %     Weight 10/12/16 1353 170 lb (77.1 kg)     Height 10/12/16 1353 5\' 9"  (1.753 m)     Head Circumference --      Peak Flow --      Pain Score 10/12/16 1355 6     Pain Loc --      Pain Edu? --      Excl. in GC? --      Constitutional: Alert and oriented. Well appearing and in no acute distress. Eyes: Conjunctivae are normal. PERRL. EOMI. Head: Atraumatic. Neck: No stridor.   Cardiovascular: Normal rate, regular rhythm. Normal S1 and S2.  Good peripheral circulation. Respiratory:  Normal respiratory effort without tachypnea or retractions. Lungs CTAB. Good air entry to the bases with no decreased or absent breath sounds. Gastrointestinal: Bowel sounds 4 quadrants. Soft and nontender to palpation. No guarding or rigidity. No palpable masses. No distention. No CVA tenderness. Musculoskeletal: Full range of motion to all extremities. No gross deformities appreciated. Neurologic:  Normal speech and language. No gross focal neurologic deficits are appreciated.  Skin:  Skin is warm, dry and intact. No rash noted. Positive for erythematous and edematous lesion to the right medial buttocks. This just lateral to the intergluteal cleft. This overlies area of previous scarring consistent with previous incision and drainage. No induration or fluctuance. No drainage. Area is tender to palpation. Psychiatric: Mood and affect are normal. Speech and behavior are normal. Patient exhibits appropriate insight and judgement.   ____________________________________________   LABS (all labs ordered are listed, but only abnormal results are displayed)  Labs Reviewed - No data to display ____________________________________________  EKG   ____________________________________________  RADIOLOGY   No results found.  ____________________________________________    PROCEDURES  Procedure(s) performed:    Procedures    Medications - No data to display   ____________________________________________   INITIAL IMPRESSION / ASSESSMENT AND PLAN / ED COURSE  Pertinent labs & imaging results that were available during my care of the patient were reviewed by me and considered in my medical decision making (see chart for details).  Review of the [place] CSRS was performed in accordance of the [facility] prior to dispensing any controlled drugs.  Clinical Course     Patient's diagnosis is consistent with Cellulitis of the right buttocks. No indication of abscess requiring incision and  drainage. No indication for imaging at this time. No labs. Patient has a history of recurring abscesses in this region. Patient was evaluated previously with CT which reveals no fistula. Patient will be treated for MRSA with oral antibiotics. He is to follow-up with dermatology for further evaluation of recurring cellulitis.  Patient is given ED precautions to return to the ED for any worsening or new symptoms.     ____________________________________________  FINAL CLINICAL IMPRESSION(S) / ED DIAGNOSES  Final diagnoses:  Cellulitis of right buttock      NEW MEDICATIONS STARTED DURING THIS VISIT:  Discharge Medication List as of 10/12/2016  3:15 PM          This chart was dictated using voice recognition software/Dragon. Despite best efforts to proofread, errors can occur which can change the meaning. Any change was purely unintentional.    Racheal PatchesJonathan D Cuthriell, PA-C 10/12/16 1617    Jeanmarie PlantJames A McShane, MD 10/12/16 2024

## 2016-10-12 NOTE — ED Triage Notes (Signed)
Says abscess tailbone.  This is 4th abscess in similar place this year.  Smaller than usual

## 2016-11-26 DIAGNOSIS — L0231 Cutaneous abscess of buttock: Secondary | ICD-10-CM | POA: Diagnosis not present

## 2018-06-06 DIAGNOSIS — Z1389 Encounter for screening for other disorder: Secondary | ICD-10-CM | POA: Diagnosis not present

## 2018-06-06 DIAGNOSIS — F3130 Bipolar disorder, current episode depressed, mild or moderate severity, unspecified: Secondary | ICD-10-CM | POA: Diagnosis not present

## 2018-06-06 DIAGNOSIS — F901 Attention-deficit hyperactivity disorder, predominantly hyperactive type: Secondary | ICD-10-CM | POA: Diagnosis not present

## 2018-06-06 DIAGNOSIS — F17200 Nicotine dependence, unspecified, uncomplicated: Secondary | ICD-10-CM | POA: Diagnosis not present

## 2018-06-06 DIAGNOSIS — F258 Other schizoaffective disorders: Secondary | ICD-10-CM | POA: Diagnosis not present

## 2018-06-06 DIAGNOSIS — F329 Major depressive disorder, single episode, unspecified: Secondary | ICD-10-CM | POA: Diagnosis not present

## 2018-06-20 DIAGNOSIS — E785 Hyperlipidemia, unspecified: Secondary | ICD-10-CM | POA: Diagnosis not present

## 2018-06-20 DIAGNOSIS — Z131 Encounter for screening for diabetes mellitus: Secondary | ICD-10-CM | POA: Diagnosis not present

## 2018-06-20 DIAGNOSIS — R030 Elevated blood-pressure reading, without diagnosis of hypertension: Secondary | ICD-10-CM | POA: Diagnosis not present

## 2018-06-20 DIAGNOSIS — F258 Other schizoaffective disorders: Secondary | ICD-10-CM | POA: Diagnosis not present

## 2018-06-20 DIAGNOSIS — F329 Major depressive disorder, single episode, unspecified: Secondary | ICD-10-CM | POA: Diagnosis not present

## 2018-06-20 DIAGNOSIS — F3130 Bipolar disorder, current episode depressed, mild or moderate severity, unspecified: Secondary | ICD-10-CM | POA: Diagnosis not present

## 2018-07-18 DIAGNOSIS — F258 Other schizoaffective disorders: Secondary | ICD-10-CM | POA: Diagnosis not present

## 2018-07-18 DIAGNOSIS — F3130 Bipolar disorder, current episode depressed, mild or moderate severity, unspecified: Secondary | ICD-10-CM | POA: Diagnosis not present

## 2018-07-18 DIAGNOSIS — F329 Major depressive disorder, single episode, unspecified: Secondary | ICD-10-CM | POA: Diagnosis not present

## 2018-07-18 DIAGNOSIS — R440 Auditory hallucinations: Secondary | ICD-10-CM | POA: Diagnosis not present

## 2023-10-10 ENCOUNTER — Emergency Department
Admission: EM | Admit: 2023-10-10 | Discharge: 2023-10-10 | Disposition: A | Discharge status: Home / Self Care | Payer: 59 | Attending: Emergency Medicine | Admitting: Emergency Medicine

## 2023-10-10 ENCOUNTER — Other Ambulatory Visit: Payer: Self-pay

## 2023-10-10 ENCOUNTER — Encounter: Payer: Self-pay | Admitting: Emergency Medicine

## 2023-10-10 DIAGNOSIS — K029 Dental caries, unspecified: Secondary | ICD-10-CM | POA: Insufficient documentation

## 2023-10-10 DIAGNOSIS — K0889 Other specified disorders of teeth and supporting structures: Secondary | ICD-10-CM | POA: Diagnosis present

## 2023-10-10 MED ORDER — OXYCODONE-ACETAMINOPHEN 5-325 MG PO TABS
1.0000 | ORAL_TABLET | Freq: Once | ORAL | Status: AC
  Administered 2023-10-10: 1 via ORAL
  Filled 2023-10-10: qty 1

## 2023-10-10 MED ORDER — OXYCODONE-ACETAMINOPHEN 5-325 MG PO TABS: 1.0000 | ORAL_TABLET | Freq: Four times a day (QID) | ORAL | 0 refills | Status: DC | PRN

## 2023-10-10 MED ORDER — BUPIVACAINE HCL 0.5 % IJ SOLN
10.0000 mL | Freq: Once | INTRAMUSCULAR | Status: AC
  Administered 2023-10-10: 10 mL
  Filled 2023-10-10: qty 10

## 2023-10-10 NOTE — ED Provider Notes (Signed)
[facility] Provider Note    Event Date/Time   First MD Initiated Contact with Patient 10/10/23 1417     (approximate)   History   Dental Pain   HPI Ralph Mcgrath is a 52 y.o. male presenting today for dental pain.  Patient states he has had pain to his right sided mandibular molars over the past 2 days.  Saw his dentist 2 days ago and had symptom onset afterwards.  He has known dental caries on that side.  They did not see any sign of an infection.  He has follow-up with them again on Tuesday.  Otherwise denies fever, chills, swelling, difficulty breathing, or difficulty swallowing.     Physical Exam   Triage Vital Signs: ED Triage Vitals  Encounter Vitals Group     BP 10/10/23 1339 (!) 153/107     Systolic BP Percentile --      Diastolic BP Percentile --      Pulse Rate 10/10/23 1339 84     Resp 10/10/23 1339 16     Temp 10/10/23 1339 98.1 F (36.7 C)     Temp Source 10/10/23 1339 Oral     SpO2 10/10/23 1339 94 %     Weight 10/10/23 1320 169 lb 12.1 oz (77 kg)     Height 10/10/23 1320 5\' 9"  (1.753 m)     Head Circumference --      Peak Flow --      Pain Score 10/10/23 1320 10     Pain Loc --      Pain Education --      Exclude from Growth Chart --     Most recent vital signs: Vitals:   10/10/23 1339  BP: (!) 153/107  Pulse: 84  Resp: 16  Temp: 98.1 F (36.7 C)  SpO2: 94%   I have reviewed the vital signs. General:  Awake, alert, no acute distress. Head:  Normocephalic, Atraumatic. EENT:  PERRL, EOMI, Oral mucosa pink and moist, Neck is supple.  Dental caries present to right sided mandibular molars.  No palpable fluctuance or abscess. Cardiovascular: Regular rate, 2+ distal pulses. Respiratory:  Normal respiratory effort, symmetrical expansion, no distress.   Extremities:  Moving all four extremities through full ROM without pain.   Neuro:  Alert and oriented.  Interacting appropriately.   Skin:  Warm, dry, no rash.   Psych:  Appropriate affect.    ED Results / Procedures / Treatments   Labs (all labs ordered are listed, but only abnormal results are displayed) Labs Reviewed - No data to display   EKG    RADIOLOGY    PROCEDURES:  Critical Care performed: No  Dental Block  Date/Time: 10/10/2023 3:14 PM  Performed by: Janith Lima, MD Authorized by: Janith Lima, MD   Consent:    Consent obtained:  Verbal   Consent given by:  Patient   Risks, benefits, and alternatives were discussed: yes     Risks discussed:  Infection, nerve damage, pain and unsuccessful block   Alternatives discussed:  No treatment Universal protocol:    Patient identity confirmed:  Verbally with patient Indications:    Indications: dental pain   Location:    Block type:  Inferior alveolar   Laterality:  Right Procedure details:    Syringe type:  Controlled syringe   Needle gauge:  25 G   Anesthetic injected:  Bupivacaine 0.25% w/o epi   Injection procedure:  Anatomic landmarks identified, introduced needle, negative  aspiration for blood, anatomic landmarks palpated and incremental injection Post-procedure details:    Outcome:  Anesthesia achieved   Procedure completion:  Tolerated well, no immediate complications    MEDICATIONS ORDERED IN ED: Medications  bupivacaine (MARCAINE) 0.5 % (with pres) injection 10 mL (10 mLs Infiltration Given by Other 10/10/23 1502)  oxyCODONE-acetaminophen (PERCOCET/ROXICET) 5-325 MG per tablet 1 tablet (1 tablet Oral Given 10/10/23 1503)     IMPRESSION / MDM / ASSESSMENT AND PLAN / ED COURSE  I reviewed the triage vital signs and the nursing notes.                              Differential diagnosis includes, but is not limited to, dental caries  Patient's presentation is most consistent with acute, uncomplicated illness.  Patient is a 52 year old male presenting today for dental pain.  Just saw the dentist 2 days ago is having symptoms of his dental caries on the  right mandibular side.  No palpable fluctuance or abscess.  No unilateral facial swelling.  No difficulty swallowing or breathing.  Patient was given a dental block and will have follow-up with his dentist 2 days from now.     FINAL CLINICAL IMPRESSION(S) / ED DIAGNOSES   Final diagnoses:  Dental caries     Rx / DC Orders   ED Discharge Orders          Ordered    oxyCODONE-acetaminophen (PERCOCET) 5-325 MG tablet  Every 6 hours PRN        10/10/23 1539             Note:  This document was prepared using Dragon voice recognition software and may include unintentional dictation errors.   Janith Lima, MD 10/10/23 1539

## 2023-10-10 NOTE — ED Notes (Signed)
See triage note  Presents with some dental pain/jaw pain Was seen by dentist on Friday  States they did some poking around  States pain became worse today

## 2023-10-10 NOTE — Discharge Instructions (Signed)
Please follow-up with your dentist within the next couple of days for reassessment and further management.  I have sent medication to your pharmacy to help with the pain.

## 2023-10-10 NOTE — ED Triage Notes (Signed)
Pt reports dental pain to right lower jaw since yesterday. Pt was seen at his dentist on Friday and they poked at it and looked around. Pt reports all was dine until yesterday. Pt reports pain is severe. Pt states has an appt on the 25th to have some teeth pulled and this is one of the ones he is having pulled. Pt reports he is planning to call tomorrow so he can see about moving appt up but he cannot deal with the pain.

## 2024-03-06 ENCOUNTER — Ambulatory Visit (INDEPENDENT_AMBULATORY_CARE_PROVIDER_SITE_OTHER): Admitting: Psychiatry

## 2024-03-06 ENCOUNTER — Encounter: Payer: Self-pay | Admitting: Psychiatry

## 2024-03-06 VITALS — BP 134/88 | HR 90 | Temp 98.2°F | Ht 69.0 in | Wt 206.8 lb

## 2024-03-06 DIAGNOSIS — F4310 Post-traumatic stress disorder, unspecified: Secondary | ICD-10-CM

## 2024-03-06 DIAGNOSIS — F250 Schizoaffective disorder, bipolar type: Secondary | ICD-10-CM

## 2024-03-06 DIAGNOSIS — F410 Panic disorder [episodic paroxysmal anxiety] without agoraphobia: Secondary | ICD-10-CM | POA: Diagnosis not present

## 2024-03-06 DIAGNOSIS — G629 Polyneuropathy, unspecified: Secondary | ICD-10-CM

## 2024-03-06 MED ORDER — RISPERIDONE 1 MG PO TABS: 1.0000 mg | ORAL_TABLET | Freq: Two times a day (BID) | ORAL | 0 refills | Status: DC

## 2024-03-06 MED ORDER — VENLAFAXINE HCL ER 75 MG PO CP24: 225.0000 mg | ORAL_CAPSULE | ORAL | 0 refills | Status: DC

## 2024-03-06 MED ORDER — GABAPENTIN 600 MG PO TABS: 1200.0000 mg | ORAL_TABLET | Freq: Three times a day (TID) | ORAL | 0 refills | Status: DC
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
## 2024-03-30 ENCOUNTER — Telehealth: Payer: Self-pay

## 2024-03-30 NOTE — Telephone Encounter (Signed)
 pt left a message that he called therapist on the list you gave him but either they not taking his insurance or they not taking new pt.

## 2024-04-03 ENCOUNTER — Ambulatory Visit: Admitting: Psychiatry

## 2024-04-06 ENCOUNTER — Encounter: Payer: Self-pay | Admitting: Psychiatry

## 2024-04-06 ENCOUNTER — Ambulatory Visit (INDEPENDENT_AMBULATORY_CARE_PROVIDER_SITE_OTHER): Admitting: Psychiatry

## 2024-04-06 VITALS — BP 150/104 | HR 88 | Temp 98.6°F | Ht 69.0 in | Wt 209.4 lb

## 2024-04-06 DIAGNOSIS — F250 Schizoaffective disorder, bipolar type: Secondary | ICD-10-CM | POA: Diagnosis not present

## 2024-04-06 DIAGNOSIS — F259 Schizoaffective disorder, unspecified: Secondary | ICD-10-CM | POA: Insufficient documentation

## 2024-04-06 DIAGNOSIS — G629 Polyneuropathy, unspecified: Secondary | ICD-10-CM | POA: Diagnosis not present

## 2024-04-06 DIAGNOSIS — F4310 Post-traumatic stress disorder, unspecified: Secondary | ICD-10-CM

## 2024-04-06 DIAGNOSIS — F410 Panic disorder [episodic paroxysmal anxiety] without agoraphobia: Secondary | ICD-10-CM

## 2024-04-06 MED ORDER — RISPERIDONE 1 MG PO TABS: 1.0000 mg | ORAL_TABLET | Freq: Two times a day (BID) | ORAL | 2 refills | Status: DC

## 2024-04-06 MED ORDER — VENLAFAXINE HCL ER 150 MG PO CP24: 300.0000 mg | ORAL_CAPSULE | ORAL | 2 refills | Status: DC
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
## 2024-04-11 ENCOUNTER — Telehealth: Payer: Self-pay

## 2024-04-11 NOTE — Telephone Encounter (Signed)
 Received a fax from patients pharmacy wanting clarification for the patients Venlafaxine  XR 150 mg 24 hr capsule SIG: take 2 capsules (300 mg total) by mouth 1 day or 1 dose please advise  Pharmacy [facility] [address] [place]), [place] - [address] Phone: [phone]  Fax: [phone]

## 2024-04-12 ENCOUNTER — Other Ambulatory Visit: Payer: Self-pay | Admitting: Psychiatry

## 2024-04-12 DIAGNOSIS — F250 Schizoaffective disorder, bipolar type: Secondary | ICD-10-CM

## 2024-04-12 MED ORDER — VENLAFAXINE HCL ER 150 MG PO CP24: 300.0000 mg | ORAL_CAPSULE | Freq: Every day | ORAL | 2 refills | Status: DC

## 2024-05-09 ENCOUNTER — Telehealth: Payer: Self-pay

## 2024-05-09 ENCOUNTER — Other Ambulatory Visit: Payer: Self-pay

## 2024-05-09 ENCOUNTER — Ambulatory Visit (INDEPENDENT_AMBULATORY_CARE_PROVIDER_SITE_OTHER): Admitting: Psychiatry

## 2024-05-09 ENCOUNTER — Encounter: Payer: Self-pay | Admitting: Psychiatry

## 2024-05-09 VITALS — BP 131/97 | HR 116 | Temp 98.3°F | Ht 69.0 in | Wt 213.0 lb

## 2024-05-09 DIAGNOSIS — F250 Schizoaffective disorder, bipolar type: Secondary | ICD-10-CM

## 2024-05-09 DIAGNOSIS — F4310 Post-traumatic stress disorder, unspecified: Secondary | ICD-10-CM | POA: Diagnosis not present

## 2024-05-09 DIAGNOSIS — G629 Polyneuropathy, unspecified: Secondary | ICD-10-CM | POA: Diagnosis not present

## 2024-05-09 DIAGNOSIS — F410 Panic disorder [episodic paroxysmal anxiety] without agoraphobia: Secondary | ICD-10-CM | POA: Diagnosis not present

## 2024-05-09 MED ORDER — PROPRANOLOL HCL 20 MG PO TABS: 40.0000 mg | ORAL_TABLET | Freq: Every day | ORAL | 0 refills | Status: DC

## 2024-05-09 MED ORDER — UZEDY 50 MG/0.14ML ~~LOC~~ SUSY: 50.0000 mg | PREFILLED_SYRINGE | SUBCUTANEOUS | 11 refills | Status: DC

## 2024-05-09 MED ORDER — RISPERIDONE ER 50 MG/0.14ML ~~LOC~~ SUSY
50.0000 mg | PREFILLED_SYRINGE | Freq: Once | SUBCUTANEOUS | Status: AC
  Administered 2024-09-06: 50 mg via SUBCUTANEOUS

## 2024-05-09 NOTE — Progress Notes (Addendum)
 BH MD/PA/NP OP Progress Note  05/09/2024 11:11 AM Ralph Mcgrath  MRN:  [id number]  Chief Complaint:  Chief Complaint  Patient presents with   Follow-up   HPI: 53 year old male presenting to [facility] for follow-up.  Patient reports that he is doing well at home but states that he continues to have thoughts about whether or not he needs to stay in the relationship with his partner.  Patient reports that the locus of his decision is based on his child hoping that he could have both parents present and per providing care for but he states that the partner continues to show no affection and states that he is really close to separating with his partner.  Patient reports that the medication changes from the last visit has been very positive and he states he is really happy and states a positive impact in his depression scores but states that his anxiety still high.  Patient reports he did some research and found that propranolol  was used by some colleagues and states he would be interested in using.  Patient has also been educated about long-acting antipsychotic medications and has been educated on the availability of foods that he to take monthly or but every 2 months shots in which the patient is very interested.  Patient also states that he was still like to participate in therapy in which no providers were accepting his insurance but another provider was found to accept his insurance and the interested in working with him by telehealth in which he was referred to [facility]. Based on this assessment interview is recommended for the patient to start propranolol  40 mg once daily while monitoring blood pressures as well as being mindful of hypotensive symptoms as he is taking another blood pressure medication hydrochlorothiazide patient currently present with a blood pressure 131/97.  Patient also prescribed Uzeti 50 mg once a month and has been sent to the pharmacy for preauthorization.  Patient is in  agreement and states that he would like to try injections.  Patient will continue Effexor  300 mg once daily.  Patient will also continue gabapentin  600 mg 3 times a day as needed for anxiety and chronic pain.  Patient is agreement with treatment plan patient with no other questions or concerns at this time.  Patient denies SI, HI, AVH.  Patient will follow up in 3 months virtually. Visit Diagnosis:    ICD-10-CM   1. Schizoaffective disorder, bipolar type (HCC)  F25.0 risperiDONE  ER (UZEDY ) SUSY 50 mg    UZEDY  50 MG/0.14ML SUSY    2. PTSD (post-traumatic stress disorder)  F43.10     3. Panic disorder  F41.0 propranolol  (INDERAL ) 20 MG tablet    4. Neuropathy  G62.9       Past Psychiatric History:  Psych Dx History: Schizoaffective disorder, bipolar.  Intermittent explosive disorder.  PTSD, ADHD.   Previous Psych Hospitalizations: 2005, states was hospitalized for schizoaffective disorder.   Outpatient treatment: Previously seeing [facility]. PCP is [facility].  - [facility] Medications Current: - Effexor  300mg  for schizoaffective disorder. - Uzedy  50mg  IM once a month.  - Gabapentin  600mg  TID for anxiety and pain. -Propranalol 40mg  once daily for anxiety - Ordered labs of CMP, CBC, and A1C for effexor  and risperidone .    Medication Trials: - Lithium - states side effects cause pt to stop, states taken over 20 years ago. - Lamictal - states side effects cause pt to stop, states taken  over 20 years ago. - Depakote - states side effects cause pt to stop, states taken over 20 years ago. - Zoloft - Reports side effects states taken 10 years ago.    Suicide & Violence:  - 17y of age, pt attempted suicide by trying to take speed.   Psychotherapy: Patient has been referred to enter [facility] for relationship and lifestyle CBT and therapy. -Patient is currently having strained relationship with his current partner in which they  share child. -Patient also is currently having lifestyle stressors of of current medical challenges and coping.   Legal: Denies any legal issues  Past Medical History:  Past Medical History:  Diagnosis Date   Depression    Peri-rectal abscess    Right   PTSD (post-traumatic stress disorder)     Past Surgical History:  Procedure Laterality Date   FOREARM SURGERY Right 2007   ORBITAL FRACTURE SURGERY Left 1992   WISDOM TOOTH EXTRACTION  1990    Family Psychiatric History: No additional  Family History:  Family History  Problem Relation Age of Onset   Depression Mother    Cancer Mother        Breast and Uterine   Hyperthyroidism Father    Bipolar disorder Father    Heart disease Father     Social History:  Social History   Socioeconomic History   Marital status: Divorced    Spouse name: Not on file   Number of children: 3   Years of education: Not on file   Highest education level: GED or equivalent  Occupational History   Not on file  Tobacco Use   Smoking status: Former    Types: Cigarettes    Start date: 02/21/2024    Quit date: 1994    Years since quitting: 31.4   Smokeless tobacco: Never  Vaping Use   Vaping status: Never Used  Substance and Sexual Activity   Alcohol use: No   Drug use: Yes    Types: Marijuana    Comment: Uses 2-3 times weekly / Last Use 04/13/16 (Verified 04/17/16)   Sexual activity: Yes  Other Topics Concern   Not on file  Social History Narrative   Not on file   Social Drivers of Health   Financial Resource Strain: Not on file  Food Insecurity: Not on file  Transportation Needs: Not on file  Physical Activity: Not on file  Stress: Not on file  Social Connections: Not on file    Allergies:  Allergies  Allergen Reactions   Lithium Other (See Comments)    Hallucinations    Metabolic Disorder Labs: No results found for: HGBA1C, MPG No results found for: PROLACTIN No results found for: CHOL, TRIG, HDL,  CHOLHDL, VLDL, LDLCALC No results found for: TSH  Therapeutic Level Labs: No results found for: LITHIUM No results found for: VALPROATE No results found for: CBMZ  Current Medications: Current Outpatient Medications  Medication Sig Dispense Refill   gabapentin  (NEURONTIN ) 600 MG tablet Take 2 tablets (1,200 mg total) by mouth 3 (three) times daily. 180 tablet 0   hydrochlorothiazide (HYDRODIURIL) 25 MG tablet Take 25 mg by mouth daily.     risperiDONE  (RISPERDAL ) 1 MG tablet Take 1 tablet (1 mg total) by mouth 2 (two) times daily. 60 tablet 2   venlafaxine  XR (EFFEXOR -XR) 150 MG 24 hr capsule Take 2 capsules (300 mg total) by mouth daily. 60 capsule 2   losartan (COZAAR) 25 MG tablet Take 25 mg by mouth daily. (Patient not  taking: Reported on 05/09/2024)     No current facility-administered medications for this visit.     Musculoskeletal: Strength & Muscle Tone: within normal limits Gait & Station: normal Patient leans: N/A  Psychiatric Specialty Exam: Review of Systems  Constitutional: Negative.   HENT: Negative.    Eyes: Negative.   Respiratory: Negative.    Cardiovascular: Negative.   Gastrointestinal: Negative.   Endocrine: Negative.   Genitourinary: Negative.   Musculoskeletal:  Positive for myalgias.  Skin: Negative.   Allergic/Immunologic: Negative.   Neurological: Negative.   Hematological: Negative.   Psychiatric/Behavioral:  Positive for dysphoric mood. The patient is nervous/anxious.     Blood pressure (!) 131/97, pulse (!) 116, temperature 98.3 F (36.8 C), temperature source Temporal, height 5' 9 (1.753 m), weight 213 lb (96.6 kg).Body mass index is 31.45 kg/m.  General Appearance: Fairly Groomed and Well Groomed  Eye Contact:  Good  Speech:  Clear and Coherent  Volume:  Decreased  Mood:  Depressed and Dysphoric  Affect:  Congruent  Thought Process:  Coherent  Orientation:  Full (Time, Place, and Person)  Thought Content: Logical    Suicidal Thoughts:  No  Homicidal Thoughts:  No  Memory:  Immediate;   Good Recent;   Good Remote;   Good  Judgement:  Good  Insight:  Good  Psychomotor Activity:  Normal  Concentration:  Concentration: Good and Attention Span: Good  Recall:  Good  Fund of Knowledge: Good  Language: Good  Akathisia:  No  Handed:  Right  AIMS (if indicated):   Assets:  Desire for Improvement Financial Resources/Insurance Housing  ADL's:  Intact  Cognition: WNL  Sleep:  Good   Screenings: PHQ2-9    Flowsheet Row Office Visit from 04/06/2024 in [facility] Office Visit from 03/06/2024 in [facility]  PHQ-2 Total Score 6 6  PHQ-9 Total Score 21 22      Flowsheet Row Office Visit from 04/06/2024 in [facility] Office Visit from 03/06/2024 in [facility] ED from 10/10/2023 in [facility] at [facility]  C-SSRS RISK CATEGORY No Risk No Risk No Risk       Assessment and Plan:  Assessment - Diagnosis: Schizoaffective disorder, bipolar type (HCC) [F25.0]  2. PTSD (post-traumatic stress disorder) [F43.10]  3. Panic disorder [F41.0]  Differential Diagnosis: Paranoid, Schizophrenia  - Progress: Baseline Appointment - Risk Factors: Suicide Risk, Worsening symptoms.   Plan - Medications: Continue taking Effexor  300mg  PO once daily, patient educated due to high dose patient may experience headache, nervousness, insomnia, sedation as well as GI irritability as well as sexual dysfunction.  Patient explained that increased dose may cause blood pressure to increase to monitor blood pressure and also to drink plenty of fluids. Labs of CBC, CMP and A1c ordered due to taking Effexor  and risperidone .  Patient instructed to go to [facility] clinic for PCP on June 25th. Start Uzedy  50mg  once a month with a plan to move to Uzed  100mg  q2 monthly.  Start Propranalol 40mg  once daily for anxiety management.  Continue taking Gabapentin  600mg  2 capsules TID for neurotpathy pain, Pt to continue medication with PCP.  Patient educated on sedation, dizziness as well as ataxia, fatigue, nystagmus as well as tremor.  Patient advised to call clinic should symptoms occur. - Psychotherapy: Patient referred to [facility] - Education: Patient educated on the benefits of therapy as well as current  related stressors that could benefit from therapy.  Patient also educated on participating in social settings with daughter as he shared at this being a primary concern and encouraged to go to [employer] or to participate in community settings.  Patient educated on medications, side effects and adverse reactions to monitor. - Follow-Up: Patient will follow up in 1 month. - Referrals: Patient referral to therapy has been provided to [facility] - Safety Planning: Patient has agreed that should he have suicidal thoughts with or without plan he will contact 911 or go to the closest emergency department.  Patient denies having a firearm within the home.  Patient agrees to call the clinic should he have worsening symptoms before next appointment.  Patient/Guardian was advised Release of Information must be obtained prior to any record release in order to collaborate their care with an outside provider. Patient/Guardian was advised if they have not already done so to contact the registration department to sign all necessary forms in order for us  to release information regarding their care.   Consent: Patient/Guardian gives verbal consent for treatment and assignment of benefits for services provided during this visit. Patient/Guardian expressed understanding and agreed to proceed.    Arlana Labor, NP 05/09/2024, 11:11 AM

## 2024-05-09 NOTE — Telephone Encounter (Signed)
 went online to [email or web address] and submitted the prior auth . - pending

## 2024-05-09 NOTE — Telephone Encounter (Signed)
 received fax requesting that a prior auth for the uzedy  50mg / 0.45ml needs a prior auth.

## 2024-05-10 NOTE — Telephone Encounter (Signed)
 uzedy  inj was denied. (paper in your basket up front for your review)

## 2024-05-17 ENCOUNTER — Other Ambulatory Visit: Payer: Self-pay | Admitting: Psychiatry

## 2024-05-17 ENCOUNTER — Telehealth: Payer: Self-pay

## 2024-05-17 DIAGNOSIS — F250 Schizoaffective disorder, bipolar type: Secondary | ICD-10-CM

## 2024-05-17 MED ORDER — RISPERIDONE 1 MG PO TABS: 1.0000 mg | ORAL_TABLET | Freq: Two times a day (BID) | ORAL | 1 refills | Status: DC

## 2024-05-17 NOTE — Telephone Encounter (Signed)
 received a fax requesting a refill on the risperidone  1mg . pt was last seen on 6-10 next appt 9-11.

## 2024-06-01 ENCOUNTER — Other Ambulatory Visit: Payer: Self-pay | Admitting: Psychiatry

## 2024-06-01 ENCOUNTER — Telehealth: Payer: Self-pay

## 2024-06-01 DIAGNOSIS — G629 Polyneuropathy, unspecified: Secondary | ICD-10-CM

## 2024-06-01 MED ORDER — GABAPENTIN 600 MG PO TABS: 1200.0000 mg | ORAL_TABLET | Freq: Three times a day (TID) | ORAL | 0 refills | Status: DC

## 2024-06-01 NOTE — Telephone Encounter (Signed)
 Patient called to request a refill of  gabapentin  (NEURONTIN ) 600 MG tablet  Last visit 04-06-24 Next visit 08-10-24  Preferred pharmacy [facility] [address] [place]), [place] - [address] Phone: [phone]  Fax: [phone]

## 2024-06-27 ENCOUNTER — Telehealth: Payer: Self-pay

## 2024-06-27 ENCOUNTER — Other Ambulatory Visit: Payer: Self-pay | Admitting: Psychiatry

## 2024-06-27 DIAGNOSIS — G629 Polyneuropathy, unspecified: Secondary | ICD-10-CM

## 2024-06-27 MED ORDER — GABAPENTIN 600 MG PO TABS: 1200.0000 mg | ORAL_TABLET | Freq: Three times a day (TID) | ORAL | 0 refills | Status: DC

## 2024-06-27 NOTE — Telephone Encounter (Signed)
 received fax requesting a 90 day supply of the gabapentin . pt was last seen on 6-10 next appt 9-11

## 2024-06-28 NOTE — Telephone Encounter (Signed)
 left message that rx had been sent to the pharmacy

## 2024-08-03 ENCOUNTER — Telehealth: Payer: Self-pay

## 2024-08-03 ENCOUNTER — Other Ambulatory Visit: Payer: Self-pay | Admitting: Psychiatry

## 2024-08-03 DIAGNOSIS — G629 Polyneuropathy, unspecified: Secondary | ICD-10-CM

## 2024-08-03 MED ORDER — GABAPENTIN 600 MG PO TABS: 1200.0000 mg | ORAL_TABLET | Freq: Three times a day (TID) | ORAL | 2 refills | Status: AC

## 2024-08-03 NOTE — Telephone Encounter (Signed)
 received notice requesting a refill on the gabapentin  600mg  pt was last seen on 6-10 next appt 9-11

## 2024-08-03 NOTE — Telephone Encounter (Signed)
 left message that rx has been sent to the pharmacy

## 2024-08-03 NOTE — Telephone Encounter (Signed)
 Refilled, Thank you.

## 2024-08-10 ENCOUNTER — Telehealth: Admitting: Psychiatry

## 2024-08-31 ENCOUNTER — Telehealth: Admitting: Psychiatry

## 2024-08-31 DIAGNOSIS — Z91199 Patient's noncompliance with other medical treatment and regimen due to unspecified reason: Secondary | ICD-10-CM

## 2024-08-31 NOTE — Progress Notes (Signed)
 Pt did not present to the appointment.

## 2024-09-04 ENCOUNTER — Telehealth: Payer: Self-pay

## 2024-09-04 ENCOUNTER — Other Ambulatory Visit: Payer: Self-pay | Admitting: Psychiatry

## 2024-09-04 DIAGNOSIS — F410 Panic disorder [episodic paroxysmal anxiety] without agoraphobia: Secondary | ICD-10-CM

## 2024-09-04 DIAGNOSIS — F250 Schizoaffective disorder, bipolar type: Secondary | ICD-10-CM

## 2024-09-04 MED ORDER — RISPERIDONE 1 MG PO TABS: 1.0000 mg | ORAL_TABLET | Freq: Two times a day (BID) | ORAL | 1 refills | Status: DC

## 2024-09-04 MED ORDER — PROPRANOLOL HCL 20 MG PO TABS: 40.0000 mg | ORAL_TABLET | Freq: Every day | ORAL | 0 refills | Status: AC

## 2024-09-04 MED ORDER — VENLAFAXINE HCL ER 150 MG PO CP24: 300.0000 mg | ORAL_CAPSULE | Freq: Every day | ORAL | 2 refills | Status: AC

## 2024-09-04 NOTE — Telephone Encounter (Addendum)
 Received a fax from patients pharmacy requesting a refill of venlafaxine  XR (EFFEXOR -XR) 150 MG 24 hr capsule, propranolol  (INDERAL ) 20 MG tablet   No show 08-31-24 No follow up scheduled    Preferred [id number] [address] [place]), [place] - [address] Phone: [phone]  Fax: [phone]

## 2024-09-06 ENCOUNTER — Encounter: Payer: Self-pay | Admitting: Psychiatry

## 2024-09-06 ENCOUNTER — Ambulatory Visit (INDEPENDENT_AMBULATORY_CARE_PROVIDER_SITE_OTHER)

## 2024-09-06 ENCOUNTER — Ambulatory Visit: Admitting: Psychiatry

## 2024-09-06 ENCOUNTER — Other Ambulatory Visit: Payer: Self-pay

## 2024-09-06 VITALS — BP 120/85 | HR 83 | Temp 97.9°F | Ht 69.0 in | Wt 217.2 lb

## 2024-09-06 DIAGNOSIS — F4310 Post-traumatic stress disorder, unspecified: Secondary | ICD-10-CM | POA: Diagnosis not present

## 2024-09-06 DIAGNOSIS — F250 Schizoaffective disorder, bipolar type: Secondary | ICD-10-CM

## 2024-09-06 DIAGNOSIS — Z79899 Other long term (current) drug therapy: Secondary | ICD-10-CM

## 2024-09-06 DIAGNOSIS — G629 Polyneuropathy, unspecified: Secondary | ICD-10-CM

## 2024-09-06 DIAGNOSIS — F410 Panic disorder [episodic paroxysmal anxiety] without agoraphobia: Secondary | ICD-10-CM

## 2024-09-06 MED ORDER — METFORMIN HCL 500 MG PO TABS: 500.0000 mg | ORAL_TABLET | Freq: Two times a day (BID) | ORAL | 2 refills | Status: AC

## 2024-09-06 NOTE — Progress Notes (Signed)
 BH MD/PA/NP OP Progress Note  09/06/2024 2:11 PM Ralph Mcgrath  MRN:  [id number]  Chief Complaint:  Chief Complaint  Patient presents with   Follow-up   HPI: 53 year old male presenting [facility] for follow-up.  Patient had multiple no-show visits due to not being able to make the virtual appointments in which he states he is frequently falling asleep before their appointments.  Patient reports he is not sleeping well stating that he is staying up 2 or more days at a time to try to get stuff done around the house.  Patient appears drowsy at this interview stating that he needs to keep working at night while after he is taking care of his daughter for home schooling.  His therapist has contacted this provider multiple times to ensure concerns regarding his welfare in which he is going to be switched from risperidone  p.o. to Uzety 50 mg once a month.  Due to concerns with noncompliance by forgetting to take the medication or missing doses.  Patient reports no new stressor stating that his partner and him are trying to raise their children appropriately.  Patient reports that he is taking care of primarily for his daughter to be able to get through her school and reports that there are no changes at the home life at this time.  Patient continues to be unemployed.  Based on this assessment review is recommended for the patient to start Uzedyi 50 mg once daily with a sample provided today.  Patient will also be ordered Uzedy  50 mg once a month for schizoaffective disorder and patient reports hallucinations auditory and visual.  Patient will is also complaining of weight gain in which based on recommendations patient will be prescribed metformin 500 mg once daily for metabolic concerns.  Patient will continue current medication regimen otherwise stated in this report, see note.  Patient with no other questions or concerns.  Patient denies SI, HI.  Patient is in agreement with treatment plan.  Patient to follow-up in  1 month. Visit Diagnosis:    ICD-10-CM   1. Schizoaffective disorder, bipolar type (HCC)  F25.0     2. Panic disorder  F41.0     3. Neuropathy  G62.9     4. PTSD (post-traumatic stress disorder)  F43.10     5. Long term current use of antipsychotic medication  Z79.899       Past Psychiatric History:  Psych Dx History: Schizoaffective disorder, bipolar.  Intermittent explosive disorder.  PTSD, ADHD.   Previous Psych Hospitalizations: 2005, states was hospitalized for schizoaffective disorder.   Outpatient treatment: Previously seeing [facility]. PCP is [facility].  - [facility] Medications Current: - Effexor  300mg  for schizoaffective disorder. - Uzedy  50mg  IM once a month.  - Gabapentin  600mg  TID for anxiety and pain. -Propranalol 40mg  once daily for anxiety     Medication Trials: - Lithium - states side effects cause pt to stop, states taken over 20 years ago. - Lamictal - states side effects cause pt to stop, states taken over 20 years ago. - Depakote - states side effects cause pt to stop, states taken over 20 years ago. - Zoloft - Reports side effects states taken 10 years ago.    Suicide & Violence:  - 17y of age, pt attempted suicide by trying to take speed.   Psychotherapy: Patient has been referred to enter [facility] for relationship and lifestyle CBT and therapy. -Patient is currently having strained relationship with  his current partner in which they share child. -Patient also is currently having lifestyle stressors of of current medical challenges and coping.   Legal: Denies any legal issues  Past Medical History:  Past Medical History:  Diagnosis Date   Depression    Peri-rectal abscess    Right   PTSD (post-traumatic stress disorder)     Past Surgical History:  Procedure Laterality Date   FOREARM SURGERY Right 2007   ORBITAL FRACTURE SURGERY Left 1992   WISDOM TOOTH EXTRACTION  1990     Family Psychiatric History: No additional  Family History:  Family History  Problem Relation Age of Onset   Depression Mother    Cancer Mother        Breast and Uterine   Hyperthyroidism Father    Bipolar disorder Father    Heart disease Father     Social History:  Social History   Socioeconomic History   Marital status: Divorced    Spouse name: Not on file   Number of children: 3   Years of education: Not on file   Highest education level: GED or equivalent  Occupational History   Not on file  Tobacco Use   Smoking status: Former    Types: Cigarettes    Start date: 02/21/2024    Quit date: 1994    Years since quitting: 31.7   Smokeless tobacco: Never  Vaping Use   Vaping status: Never Used  Substance and Sexual Activity   Alcohol use: No   Drug use: Yes    Types: Marijuana    Comment: Uses 2-3 times weekly / Last Use 04/13/16 (Verified 04/17/16)   Sexual activity: Yes  Other Topics Concern   Not on file  Social History Narrative   Not on file   Social Drivers of Health   Financial Resource Strain: Low Risk  (05/24/2024)   Received from [facility]   Overall Financial Resource Strain (CARDIA)    Difficulty of Paying Living Expenses: Not very hard  Food Insecurity: No Food Insecurity (05/24/2024)   [facility]   Hunger Vital Sign    Within the past 12 months, you worried that your food would run out before you got the money to buy more.: Never true    Within the past 12 months, the food you bought just didn't last and you didn't have money to get more.: Never true  Transportation Needs: No Transportation Needs (05/24/2024)   Received from [facility] - Transportation    In the past 12 months, has lack of transportation kept you from medical appointments or from getting medications?: No    Lack of Transportation (Non-Medical): No  Physical Activity: Not on file  Stress: Not on file   Social Connections: Not on file    Allergies:  Allergies  Allergen Reactions   Lithium Other (See Comments)    Hallucinations    Metabolic Disorder Labs: No results found for: HGBA1C, MPG No results found for: PROLACTIN No results found for: CHOL, TRIG, HDL, CHOLHDL, VLDL, LDLCALC No results found for: TSH  Therapeutic Level Labs: No results found for: LITHIUM No results found for: VALPROATE No results found for: CBMZ  Current Medications: Current Outpatient Medications  Medication Sig Dispense Refill   gabapentin  (NEURONTIN ) 600 MG tablet Take 2 tablets (1,200 mg total) by mouth 3 (three) times daily. 180 tablet 2   hydrochlorothiazide (HYDRODIURIL) 25 MG tablet Take 25 mg by mouth daily.  losartan (COZAAR) 25 MG tablet Take 25 mg by mouth daily. (Patient not taking: Reported on 05/09/2024)     propranolol  (INDERAL ) 20 MG tablet Take 2 tablets (40 mg total) by mouth daily. 30 tablet 0   risperiDONE  (RISPERDAL ) 1 MG tablet Take 1 tablet (1 mg total) by mouth 2 (two) times daily. 180 tablet 1   venlafaxine  XR (EFFEXOR -XR) 150 MG 24 hr capsule Take 2 capsules (300 mg total) by mouth daily. 60 capsule 2   Current Facility-Administered Medications  Medication Dose Route Frequency Provider Last Rate Last Admin   risperiDONE  ER (UZEDY ) SUSY 50 mg  50 mg Subcutaneous Once          Musculoskeletal: Strength & Muscle Tone: within normal limits Gait & Station: normal Patient leans: N/A   Psychiatric Specialty Exam: Review of Systems  Constitutional: Negative.   HENT: Negative.    Eyes: Negative.   Respiratory: Negative.    Cardiovascular: Negative.   Gastrointestinal: Negative.   Endocrine: Negative.   Genitourinary: Negative.   Musculoskeletal:  Positive for myalgias.  Skin: Negative.   Allergic/Immunologic: Negative.   Neurological: Negative.   Hematological: Negative.   Psychiatric/Behavioral:  Positive for dysphoric mood. The patient is  nervous/anxious.      Blood pressure 120/85, pulse 83, temperature 97.9 F (36.6 C), temperature source Temporal, height 5' 9 (1.753 m), weight 217 lb 3.2 oz (98.5 kg).Body mass index is 32.07 kg/m.  General Appearance: Fairly Groomed and Well Groomed  Eye Contact:  Good  Speech:  Clear and Coherent  Volume:  Decreased  Mood:  Depressed and Dysphoric  Affect:  Congruent  Thought Process:  Coherent  Orientation:  Full (Time, Place, and Person)  Thought Content: Logical   Suicidal Thoughts:  No  Homicidal Thoughts:  No  Memory:  Immediate;   Good Recent;   Good Remote;   Good  Judgement:  Good  Insight:  Good  Psychomotor Activity:  Normal  Concentration:  Concentration: Good and Attention Span: Good  Recall:  Good  Fund of Knowledge: Good  Language: Good  Akathisia:  No  Handed:  Right  AIMS (if indicated):   Assets:  Desire for Improvement Financial Resources/Insurance Housing  ADL's:  Intact  Cognition: WNL  Sleep:  Good   Screenings: GAD-7    Flowsheet Row Office Visit from 05/09/2024 in [facility]  Total GAD-7 Score 20   PHQ2-9    Flowsheet Row Office Visit from 05/09/2024 in [facility] Office Visit from 04/06/2024 in [facility] Office Visit from 03/06/2024 in [facility]  PHQ-2 Total Score 6 6 6   PHQ-9 Total Score 20 21 22    Flowsheet Row Office Visit from 05/09/2024 in [facility] Office Visit from 04/06/2024 in [facility] Office Visit from 03/06/2024 in [facility]  C-SSRS RISK CATEGORY No Risk No Risk No Risk     Assessment and Plan:  Assessment - Diagnosis: Schizoaffective disorder, bipolar type (HCC) [F25.0]  2. PTSD (post-traumatic stress disorder) [F43.10]  3. Panic  disorder [F41.0]  4. Neuropathy [G62.9]  5. Long term current use of antipsychotic medication [Z79.899]  Differential Diagnosis: Paranoid, Schizophrenia  - Progress: Baseline Appointment - Risk Factors: Suicide Risk, Worsening symptoms.   Plan - Medications: Continue taking Effexor  300mg  PO once daily, patient educated due to high dose patient may experience headache, nervousness, insomnia,  sedation as well as GI irritability as well as sexual dysfunction.  Patient explained that increased dose may cause blood pressure to increase to monitor blood pressure and also to drink plenty of fluids. Labs of CBC, CMP and A1c ordered due to taking Effexor  and risperidone .  Patient instructed to go to [facility] clinic for PCP on June 25th. Start Uzedy  50mg  once a month with a plan to move to Uzedy  100mg  q2 monthly.  Continue Propranalol 40mg  once daily for anxiety management.  Continue taking Gabapentin  600mg  2 capsules TID for neurotpathy pain, Pt to continue medication with PCP.  Patient educated on sedation, dizziness as well as ataxia, fatigue, nystagmus as well as tremor.  Patient advised to call clinic should symptoms occur. Start Metformin, in combination with long term antipsychotic use.  - Psychotherapy: Patient referred to [facility] - Education: Patient educated on the benefits of therapy as well as current related stressors that could benefit from therapy.  Patient also educated on participating in social settings with daughter as he shared at this being a primary concern and encouraged to go to [employer] or to participate in community settings.  Patient educated on medications, side effects and adverse reactions to monitor. - Follow-Up: Patient will follow up in 1 month. - Referrals: Patient referral to therapy has been provided to [facility] - Safety Planning: Patient has agreed that should he have suicidal thoughts with or without plan he will contact 911 or go to the  closest emergency department.  Patient denies having a firearm within the home.  Patient agrees to call the clinic should he have worsening symptoms before next appointment.  Patient/Guardian was advised Release of Information must be obtained prior to any record release in order to collaborate their care with an outside provider. Patient/Guardian was advised if they have not already done so to contact the registration department to sign all necessary forms in order for us  to release information regarding their care.   Consent: Patient/Guardian gives verbal consent for treatment and assignment of benefits for services provided during this visit. Patient/Guardian expressed understanding and agreed to proceed.    Dorn Jama Der, NP 09/06/2024, 2:11 PM

## 2024-09-06 NOTE — Progress Notes (Unsigned)
 Patient present flat affect mood was pleasant and denied visual or auditory hallucinations. No suicidal or homicidal ideations, no plan, intent, or means to want to harm self or others. Patients Uzedy  50 mg/0.14 ml IM injection prepared as ordered and administered to patient in his right arm. Patient tolerated without discomfort or pain. Patient will return in 1 month and will call if there is any changes.  NDC 4820-694-88 LOT [id number] EXP 06-2026

## 2024-09-06 NOTE — Patient Instructions (Addendum)
 Patient present flat affect mood was pleasant and denied visual or auditory hallucinations. No suicidal or homicidal ideations, no plan, intent, or means to want to harm self or others. Patients Uzedy  50 mg/0.14 ml IM injection prepared as ordered and administered to patient in his right arm. Patient tolerated without discomfort or pain. Patient will return in 1 month and will call if there is any changes.  NDC 4820-694-88 LOT [id number] EXP 06-2026

## 2024-09-07 ENCOUNTER — Telehealth: Payer: Self-pay | Admitting: Psychiatry

## 2024-09-07 NOTE — Telephone Encounter (Signed)
 Patient left office without scheduling his follow up with provider. He also did not stop to schedule the follow up for his next injections. A message was left for him to call and schedule both appointments for 1 month after 09-06-24

## 2024-09-28 ENCOUNTER — Telehealth: Payer: Self-pay

## 2024-09-28 NOTE — Telephone Encounter (Signed)
 Medication problem - Call from patient stating he had his first Uzedy  injection 09/06/24 and it burned a little when he first got it. Patient stated since then, everything has been fine but if he bumps the area it will burn a little again. Patient denied any redness, heat from area, swelling, or any signs from the site of any skin issues.  Patient stated he was thinking it feels like some is still in that area and maybe trapped so when he hits it if feels like when he got the initial injection.  Patient agreed to try a warm compress on the area to see if this would help and again assured RN of no signs of infection or other discomfort.  Patient agreed to come into the office if he would like our staff to look at the area and to go to an Urgent care or ED if he started to run any temperatures, had any swelling in the area, any red marks from the area, etc.  Patient reported he is due for another injection on 10/05/24 and is set to follow up with NP on 10/04/24.  Patient to call back if any worsening of symptoms with previous first time injection site.

## 2024-10-04 ENCOUNTER — Ambulatory Visit: Admitting: Psychiatry

## 2024-10-05 ENCOUNTER — Ambulatory Visit

## 2024-10-05 ENCOUNTER — Other Ambulatory Visit: Payer: Self-pay | Admitting: Psychiatry

## 2024-10-05 ENCOUNTER — Telehealth: Payer: Self-pay | Admitting: Psychiatry

## 2024-10-05 MED ORDER — UZEDY 50 MG/0.14ML ~~LOC~~ SUSY: 50.0000 mg | PREFILLED_SYRINGE | SUBCUTANEOUS | 11 refills | Status: AC

## 2024-10-05 MED ORDER — RISPERIDONE ER 50 MG/0.14ML ~~LOC~~ SUSY: 50.0000 mg | PREFILLED_SYRINGE | Freq: Once | SUBCUTANEOUS | Status: AC

## 2024-10-05 NOTE — Telephone Encounter (Signed)
 Patient came late for his injection. Advised him that due to the office policy of attendance he would need to look for a new provider. He has missed 3 scheduled appointments. Offered to still administer his injection, but stated why would I want you to do that, and I've had problems with it anyway, no that's fine. Stated we deal with mental health and should be here to help but reminded him of the attendance policy and once again offered injection. He said no and walked out.
